# Patient Record
Sex: Female | Born: 1975
Health system: Southern US, Community
[De-identification: ages and names within clinical notes are randomized; demographics above are authoritative.]

## PROBLEM LIST (undated history)

## (undated) DIAGNOSIS — K219 Gastro-esophageal reflux disease without esophagitis: Secondary | ICD-10-CM

## (undated) DIAGNOSIS — F419 Anxiety disorder, unspecified: Secondary | ICD-10-CM

## (undated) HISTORY — DX: Anxiety disorder, unspecified: F41.9

## (undated) HISTORY — PX: MOUTH SURGERY: SHX715

---

## 2011-08-07 ENCOUNTER — Ambulatory Visit: Payer: Self-pay | Admitting: Obstetrics and Gynecology

## 2012-02-29 ENCOUNTER — Ambulatory Visit (INDEPENDENT_AMBULATORY_CARE_PROVIDER_SITE_OTHER): Payer: BC Managed Care – HMO | Admitting: Obstetrics and Gynecology

## 2012-02-29 ENCOUNTER — Encounter: Payer: Self-pay | Admitting: Obstetrics and Gynecology

## 2012-02-29 VITALS — BP 106/70 | Resp 14 | Ht 66.0 in | Wt 280.0 lb

## 2012-02-29 DIAGNOSIS — Z01419 Encounter for gynecological examination (general) (routine) without abnormal findings: Secondary | ICD-10-CM

## 2012-02-29 DIAGNOSIS — N920 Excessive and frequent menstruation with regular cycle: Secondary | ICD-10-CM

## 2012-02-29 DIAGNOSIS — Z124 Encounter for screening for malignant neoplasm of cervix: Secondary | ICD-10-CM

## 2012-02-29 DIAGNOSIS — Z113 Encounter for screening for infections with a predominantly sexual mode of transmission: Secondary | ICD-10-CM

## 2012-02-29 DIAGNOSIS — L680 Hirsutism: Secondary | ICD-10-CM

## 2012-02-29 LAB — CBC
HCT: 31.5 % — ABNORMAL LOW (ref 36.0–46.0)
Hemoglobin: 9.3 g/dL — ABNORMAL LOW (ref 12.0–15.0)
MCHC: 29.5 g/dL — ABNORMAL LOW (ref 30.0–36.0)
RBC: 4.27 MIL/uL (ref 3.87–5.11)
WBC: 7 10*3/uL (ref 4.0–10.5)

## 2012-02-29 MED ORDER — EFLORNITHINE HCL 13.9 % EX CREA: 1.0000 "application " | TOPICAL_CREAM | Freq: Two times a day (BID) | CUTANEOUS | Status: DC

## 2012-02-29 NOTE — Addendum Note (Signed)
Addended by: Marla Roe A on: 02/29/2012 12:20 PM   Modules accepted: Orders

## 2012-02-29 NOTE — Progress Notes (Signed)
Patient ID: Kirah Stice, female   DOB: 05-17-1975, 36 y.o.   MRN: [id number] Contraception none Last pap 2010 wnl Last Mammo never Last Colonoscopy never Last Dexa Scan never Primary MD none Abuse at Home none  Reports heavy cycle changing pad q56min on heavy day.  She says her cycles are regular.  Filed Vitals:   02/29/12 1118  BP: 106/70  Resp: 14   ROS: noncontributory  Physical Examination: General appearance - alert, well appearing, and in no distress, facial hair Neck - supple, no significant adenopathy Chest - clear to auscultation, no wheezes, rales or rhonchi, symmetric air entry Heart - normal rate and regular rhythm Abdomen - soft, nontender, nondistended, no masses or organomegaly Breasts - breasts appear normal, no suspicious masses, no skin or nipple changes or axillary nodes Pelvic - normal external genitalia, vulva, vagina, cervix, uterus and adnexa Back exam - no CVAT Extremities - no edema, redness or tenderness in the calves or thighs  A/P Hirsutism - check labs - trial of vaniqa Menorrhagia - check labs, sched u/s and embx RTO for u/s and f/u and embx

## 2012-03-01 LAB — TSH: TSH: 1.333 u[IU]/mL (ref 0.350–4.500)

## 2012-03-01 LAB — HIV ANTIBODY (ROUTINE TESTING W REFLEX): HIV: NONREACTIVE

## 2012-03-01 LAB — TESTOSTERONE, FREE, TOTAL, SHBG: Testosterone, Free: 10.6 pg/mL — ABNORMAL HIGH (ref 0.6–6.8)

## 2012-03-01 LAB — VITAMIN D 25 HYDROXY (VIT D DEFICIENCY, FRACTURES): Vit D, 25-Hydroxy: 11 ng/mL — ABNORMAL LOW (ref 30–89)

## 2012-03-04 LAB — PAP IG, CT-NG, RFX HPV ASCU
Chlamydia Probe Amp: NEGATIVE
GC Probe Amp: NEGATIVE

## 2012-03-06 ENCOUNTER — Telehealth: Payer: Self-pay

## 2012-03-06 ENCOUNTER — Other Ambulatory Visit: Payer: Self-pay

## 2012-03-06 DIAGNOSIS — E559 Vitamin D deficiency, unspecified: Secondary | ICD-10-CM

## 2012-03-06 NOTE — Telephone Encounter (Signed)
Spoke to pt to inform of test results. Vit D protocol done. Rx called to [facility] on [address] [employer]. Vit D soft-gels 50,000 units sig: 1 po twice weekly x 8 weeks # 16 0 RF's. Iron supplement recommended daily as well.  Virginia Flowers

## 2012-03-14 NOTE — Telephone Encounter (Signed)
Note to close encounter. Virginia Flowers, Virginia Flowers  

## 2012-04-01 ENCOUNTER — Telehealth: Payer: Self-pay | Admitting: Obstetrics and Gynecology

## 2012-04-01 NOTE — Telephone Encounter (Signed)
LM for pt to cb re: ? She had about letter she rec'd. Melody Comas A

## 2012-04-04 ENCOUNTER — Telehealth: Payer: Self-pay

## 2012-04-04 NOTE — Telephone Encounter (Signed)
Spoke with pt this am regarding letter that she received in the mail.  Pt was advised that the letter was regarding future lab ordered for Vit-d. Pt also had questions about HSV 1 and 2. Pt was given necessary information.  Pt voice understanding. Mathis Bud

## 2012-04-10 ENCOUNTER — Other Ambulatory Visit: Payer: BC Managed Care – HMO

## 2012-04-10 ENCOUNTER — Encounter: Payer: BC Managed Care – HMO | Admitting: Obstetrics and Gynecology

## 2012-05-07 ENCOUNTER — Encounter: Payer: BC Managed Care – HMO | Admitting: Obstetrics and Gynecology

## 2012-05-07 ENCOUNTER — Other Ambulatory Visit: Payer: BC Managed Care – HMO

## 2012-06-15 ENCOUNTER — Other Ambulatory Visit: Payer: Self-pay

## 2012-10-05 DIAGNOSIS — D649 Anemia, unspecified: Secondary | ICD-10-CM | POA: Insufficient documentation

## 2013-03-06 ENCOUNTER — Other Ambulatory Visit: Payer: Self-pay

## 2013-06-04 DIAGNOSIS — M7660 Achilles tendinitis, unspecified leg: Secondary | ICD-10-CM | POA: Insufficient documentation

## 2013-10-29 ENCOUNTER — Other Ambulatory Visit: Payer: Self-pay | Admitting: Obstetrics and Gynecology

## 2013-10-29 DIAGNOSIS — Z1231 Encounter for screening mammogram for malignant neoplasm of breast: Secondary | ICD-10-CM

## 2013-11-12 ENCOUNTER — Ambulatory Visit
Admission: RE | Admit: 2013-11-12 | Discharge: 2013-11-12 | Disposition: A | Discharge status: Home / Self Care | Payer: BC Managed Care – PPO | Source: Ambulatory Visit | Attending: Obstetrics and Gynecology | Admitting: Obstetrics and Gynecology

## 2013-11-12 DIAGNOSIS — Z1231 Encounter for screening mammogram for malignant neoplasm of breast: Secondary | ICD-10-CM

## 2014-04-16 ENCOUNTER — Encounter (HOSPITAL_COMMUNITY): Payer: Self-pay | Admitting: Cardiology

## 2014-04-16 ENCOUNTER — Emergency Department (HOSPITAL_COMMUNITY)
Admission: EM | Admit: 2014-04-16 | Discharge: 2014-04-16 | Disposition: A | Discharge status: Home / Self Care | Payer: BC Managed Care – PPO | Attending: Emergency Medicine | Admitting: Emergency Medicine

## 2014-04-16 ENCOUNTER — Emergency Department (HOSPITAL_COMMUNITY): Payer: BC Managed Care – PPO

## 2014-04-16 DIAGNOSIS — R0600 Dyspnea, unspecified: Secondary | ICD-10-CM | POA: Diagnosis not present

## 2014-04-16 DIAGNOSIS — Z720 Tobacco use: Secondary | ICD-10-CM | POA: Insufficient documentation

## 2014-04-16 DIAGNOSIS — R110 Nausea: Secondary | ICD-10-CM | POA: Diagnosis not present

## 2014-04-16 DIAGNOSIS — K219 Gastro-esophageal reflux disease without esophagitis: Secondary | ICD-10-CM | POA: Insufficient documentation

## 2014-04-16 DIAGNOSIS — R091 Pleurisy: Secondary | ICD-10-CM | POA: Diagnosis not present

## 2014-04-16 DIAGNOSIS — R079 Chest pain, unspecified: Secondary | ICD-10-CM | POA: Diagnosis not present

## 2014-04-16 DIAGNOSIS — Z79899 Other long term (current) drug therapy: Secondary | ICD-10-CM | POA: Diagnosis not present

## 2014-04-16 DIAGNOSIS — M25571 Pain in right ankle and joints of right foot: Secondary | ICD-10-CM

## 2014-04-16 LAB — BASIC METABOLIC PANEL
Anion gap: 8 (ref 5–15)
BUN: 5 mg/dL — AB (ref 6–23)
CALCIUM: 8.7 mg/dL (ref 8.4–10.5)
CHLORIDE: 104 meq/L (ref 96–112)
CO2: 27 mEq/L (ref 19–32)
CREATININE: 0.74 mg/dL (ref 0.50–1.10)
GFR calc non Af Amer: >90 mL/min (ref >90)
Glucose, Bld: 83 mg/dL (ref 70–99)
Potassium: 3.9 mEq/L (ref 3.7–5.3)
Sodium: 139 mEq/L (ref 137–147)

## 2014-04-16 LAB — URINALYSIS, ROUTINE W REFLEX MICROSCOPIC
Bilirubin Urine: NEGATIVE
Glucose, UA: NEGATIVE mg/dL
Ketones, ur: NEGATIVE mg/dL
Nitrite: NEGATIVE
PH: 7.5 (ref 5.0–8.0)
Protein, ur: NEGATIVE mg/dL
Specific Gravity, Urine: 1.008 (ref 1.005–1.030)
Urobilinogen, UA: 0.2 mg/dL (ref 0.0–1.0)

## 2014-04-16 LAB — CBC
HEMATOCRIT: 26.9 % — AB (ref 36.0–46.0)
Hemoglobin: 7.4 g/dL — ABNORMAL LOW (ref 12.0–15.0)
MCH: 19.3 pg — AB (ref 26.0–34.0)
MCHC: 27.5 g/dL — AB (ref 30.0–36.0)
MCV: 70.2 fL — AB (ref 78.0–100.0)
PLATELETS: 198 10*3/uL (ref 150–400)
RBC: 3.83 MIL/uL — ABNORMAL LOW (ref 3.87–5.11)
RDW: 19.7 % — ABNORMAL HIGH (ref 11.5–15.5)
WBC: 4.5 10*3/uL (ref 4.0–10.5)

## 2014-04-16 LAB — LIPASE, BLOOD: Lipase: 16 U/L (ref 11–59)

## 2014-04-16 LAB — HEPATIC FUNCTION PANEL
ALT: 8 U/L (ref 0–35)
AST: 13 U/L (ref 0–37)
Albumin: 3.3 g/dL — ABNORMAL LOW (ref 3.5–5.2)
Alkaline Phosphatase: 64 U/L (ref 39–117)
Bilirubin, Direct: <0.2 mg/dL (ref 0.0–0.3)
Total Bilirubin: <0.2 mg/dL — ABNORMAL LOW (ref 0.3–1.2)
Total Protein: 6.9 g/dL (ref 6.0–8.3)

## 2014-04-16 LAB — D-DIMER, QUANTITATIVE (NOT AT ARMC): D-Dimer, Quant: 0.73 ug/mL-FEU — ABNORMAL HIGH (ref 0.00–0.48)

## 2014-04-16 LAB — URINE MICROSCOPIC-ADD ON

## 2014-04-16 LAB — I-STAT TROPONIN, ED: Troponin i, poc: 0 ng/mL (ref 0.00–0.08)

## 2014-04-16 LAB — PRO B NATRIURETIC PEPTIDE: Pro B Natriuretic peptide (BNP): 61.4 pg/mL (ref 0–125)

## 2014-04-16 LAB — TROPONIN I: Troponin I: <0.3 ng/mL (ref <0.30)

## 2014-04-16 MED ORDER — HYDROMORPHONE HCL 1 MG/ML IJ SOLN
1.0000 mg | Freq: Once | INTRAMUSCULAR | Status: AC
  Administered 2014-04-16: 1 mg via INTRAVENOUS
  Filled 2014-04-16: qty 1

## 2014-04-16 MED ORDER — IOHEXOL 350 MG/ML SOLN
80.0000 mL | Freq: Once | INTRAVENOUS | Status: AC | PRN
  Administered 2014-04-16: 80 mL via INTRAVENOUS

## 2014-04-16 NOTE — ED Notes (Signed)
Pt returned from X-ray.  

## 2014-04-16 NOTE — Discharge Instructions (Signed)

## 2014-04-16 NOTE — ED Notes (Signed)
Pt reports generalized chest pain that started yesterday. States that she has also had some SOB. Pt reports right ankle pain also.

## 2014-04-16 NOTE — ED Provider Notes (Signed)
CSN: [id number]     Arrival date & time 04/16/14  [id number] History   First MD Initiated Contact with Patient 04/16/14 0945     Chief Complaint  Patient presents with  . Chest Pain  . Ankle Pain     (Consider location/radiation/quality/duration/timing/severity/associated sxs/prior Treatment) Patient is a 38 y.o. female presenting with chest pain.  Chest Pain Pain location:  L chest Pain quality: sharp   Pain radiates to:  Does not radiate Pain radiates to the back: no   Pain severity:  Moderate Onset quality:  Gradual Duration:  1 day Timing:  Constant Progression:  Unchanged Chronicity:  New Context: breathing   Relieved by:  Nothing Worsened by:  Coughing and deep breathing Associated symptoms: cough and nausea   Associated symptoms: no abdominal pain, no fever, no near-syncope and not vomiting     History reviewed. No pertinent past medical history. History reviewed. No pertinent past surgical history. History reviewed. No pertinent family history. History  Substance Use Topics  . Smoking status: Current Every Day Smoker -- 1.00 packs/day for 18 years    Types: Cigarettes  . Smokeless tobacco: Not on file  . Alcohol Use: Not on file   OB History    Gravida Para Term Preterm AB TAB SAB Ectopic Multiple Living   1 1 1       1      Review of Systems  Constitutional: Negative for fever.  Respiratory: Positive for cough.   Cardiovascular: Positive for chest pain. Negative for near-syncope.  Gastrointestinal: Positive for nausea. Negative for vomiting and abdominal pain.  All other systems reviewed and are negative.     Allergies  Review of patient's allergies indicates no known allergies.  Home Medications   Prior to Admission medications   Medication Sig Start Date End Date Taking? Authorizing Provider  omeprazole (PRILOSEC) 20 MG capsule Take 20 mg by mouth daily. 06/04/13  Yes Historical Provider, MD  Eflornithine HCl 13.9 % cream Apply 1 application topically  2 (two) times daily. At least 8hrs apart and may take 4-8wks to see improvement.  Apply to affected area Patient not taking: Reported on 04/16/2014 02/29/12   Purcell NailsAngela Y Roberts, MD   BP 100/54 mmHg  Pulse 70  Temp(Src) 98.2 F (36.8 C) (Oral)  Resp 19  Wt 280 lb (127.007 kg)  SpO2 100%  LMP 04/15/2014 Physical Exam  Constitutional: She is oriented to person, place, and time. She appears well-developed and well-nourished.  HENT:  Head: Normocephalic and atraumatic.  Right Ear: External ear normal.  Left Ear: External ear normal.  Eyes: Conjunctivae and EOM are normal. Pupils are equal, round, and reactive to light.  Neck: Normal range of motion. Neck supple.  Cardiovascular: Normal rate, regular rhythm, normal heart sounds and intact distal pulses.   Pulmonary/Chest: Effort normal and breath sounds normal.  Abdominal: Soft. Bowel sounds are normal. There is no tenderness.  Musculoskeletal: Normal range of motion.  Neurological: She is alert and oriented to person, place, and time.  Skin: Skin is warm and dry.  Vitals reviewed.   ED Course  Procedures (including critical care time) Labs Review Labs Reviewed  CBC - Abnormal; Notable for the following:    RBC 3.83 (*)    Hemoglobin 7.4 (*)    HCT 26.9 (*)    MCV 70.2 (*)    MCH 19.3 (*)    MCHC 27.5 (*)    RDW 19.7 (*)    All other components within normal limits  BASIC METABOLIC PANEL - Abnormal; Notable for the following:    BUN 5 (*)    All other components within normal limits  D-DIMER, QUANTITATIVE - Abnormal; Notable for the following:    D-Dimer, Quant 0.73 (*)    All other components within normal limits  URINALYSIS, ROUTINE W REFLEX MICROSCOPIC - Abnormal; Notable for the following:    Hgb urine dipstick LARGE (*)    Leukocytes, UA TRACE (*)    All other components within normal limits  HEPATIC FUNCTION PANEL - Abnormal; Notable for the following:    Albumin 3.3 (*)    Total Bilirubin <0.2 (*)    All other  components within normal limits  PRO B NATRIURETIC PEPTIDE  LIPASE, BLOOD  TROPONIN I  URINE MICROSCOPIC-ADD ON  Rosezena Sensor, ED    Imaging Review Dg Chest 2 View  04/16/2014   CLINICAL DATA:  Two day history of left-sided upper chest pain  EXAM: CHEST  2 VIEW  COMPARISON:  None.  FINDINGS: There is minimal left base atelectasis. Elsewhere lungs are clear. Heart is upper normal in size with pulmonary vascularity within normal limits. No adenopathy. No bone lesions.  IMPRESSION: Minimal left atelectatic change.  No edema or consolidation.   Electronically Signed   By: Bretta Bang M.D.   On: 04/16/2014 10:58   Ct Angio Chest Pe W/cm &/or Wo Cm  04/16/2014   CLINICAL DATA:  Left-sided chest pain  EXAM: CT ANGIOGRAPHY CHEST WITH CONTRAST  TECHNIQUE: Multidetector CT imaging of the chest was performed using the standard protocol during bolus administration of intravenous contrast. Multiplanar CT image reconstructions and MIPs were obtained to evaluate the vascular anatomy.  CONTRAST:  80mL OMNIPAQUE IOHEXOL 350 MG/ML SOLN  COMPARISON:  Chest radiograph April 16, 2014  FINDINGS: There is no demonstrable pulmonary embolus. There is no thoracic aortic aneurysm or dissection.  There is patchy lower lobe atelectatic change bilaterally.  On axial slice 32 series 6, there is a 4 mm nodular opacity in the anterior segment of the right upper lobe. On axial slice 35 series 6, there is a 3 mm nodular opacity in the anterior segment of the right upper lobe. On axial slice 53 series 6, there is a 3 mm nodular opacity in the lateral segment of the right middle lobe. On axial slice 38 series 6, there is a 4 mm nodular opacity in the superior segment of the left lower lobe.  There is no appreciable thoracic adenopathy. There are small axillary lymph nodes bilaterally which do not meet size criteria for pathologic significance. The pericardium is not thickened.  In the visualized upper abdomen, no focal  lesions are appreciable.  Visualized thyroid appears normal. There are no blastic or lytic bone lesions.  Review of the MIP images confirms the above findings.  IMPRESSION: Patchy bibasilar lung atelectasis. No demonstrable pulmonary embolus. Small nodular opacities in the lungs as noted above. Followup of these nodular opacity should be based on Fleischner Society guidelines. If the patient is at high risk for bronchogenic carcinoma, follow-up chest CT at 1year is recommended. If the patient is at low risk, no follow-up is needed. This recommendation follows the consensus statement: Guidelines for Management of Small Pulmonary Nodules Detected on CT Scans: A Statement from the Fleischner Society as published in Radiology 2005; 237:395-400.   Electronically Signed   By: Bretta Bang M.D.   On: 04/16/2014 15:23     EKG Interpretation   Date/Time:  Thursday April 16 2014 09:39:30  EST Ventricular Rate:  85 PR Interval:  148 QRS Duration: 82 QT Interval:  360 QTC Calculation: 428 R Axis:   90 Text Interpretation:  Normal sinus rhythm Rightward axis Nonspecific T  wave abnormality Abnormal ECG No old tracing to compare Confirmed by  Mirian MoGentry, Matthew [phone]([id number]) on 04/16/2014 10:01:05 AM     Emergency Ultrasound Study:   Angiocath insertion Performed by: Mirian MoGentry, Matthew  Consent: Verbal consent obtained. Risks and benefits: risks, benefits and alternatives were discussed Immediately prior to procedure the correct patient, procedure, equipment, support staff and site/side marked as needed.  Indication: difficult IV access Preparation: Patient was prepped and draped in the usual sterile fashion. Vein Location: R brachial vein was visualized during assessment for potential access sites and was found to be patent/ easily compressed with linear ultrasound.  The needle was visualized with real-time ultrasound and guided into the vein. Gauge: 18  Image saved and stored.  Normal blood return.   Patient tolerance: Patient tolerated the procedure well with no immediate complications.     MDM   Final diagnoses:  Pleurisy  Ankle pain, right  Dyspnea  Chest pain, unspecified chest pain type    38 y.o. female with pertinent PMH of GERD presents with chest pain as above.  Symptoms improving.  Physical exam and vitals as above.  Pt has had no appreciable change over the past 6 hours.  Labs as above with positive ddimer.  CT PE study ordered and without PE, but with multiple nodular opacities.  Unknown if this is the etiology of her current symptoms, do not suspect pneumonia given clinical scenario. I discussed the results of the CT scan with the patient and encouraged very close follow-up. As she's had no change in symptoms do not feel delta troponin is warranted.  Prior to my discussion of results the patient came out from her room and asked to have her IV removed. She stated she wanted to leave. I feel that her discharge is reasonable time given negative workup. Discharged home in stable condition with strict return precautions for any worsening symptoms.  I have reviewed all laboratory and imaging studies if ordered as above  1. Ankle pain, right   2. Chest pain   3. Pleurisy   4. Dyspnea   5. Chest pain, unspecified chest pain type         Mirian MoMatthew Gentry, MD 04/16/14 1550

## 2016-01-08 IMAGING — CR DG CHEST 2V
2 series · 2 of 2 positions shown · non-contrast
Comparison: None.

CLINICAL DATA: Two day history of left-sided upper chest pain

EXAM:
CHEST  2 VIEW

[chest pa]
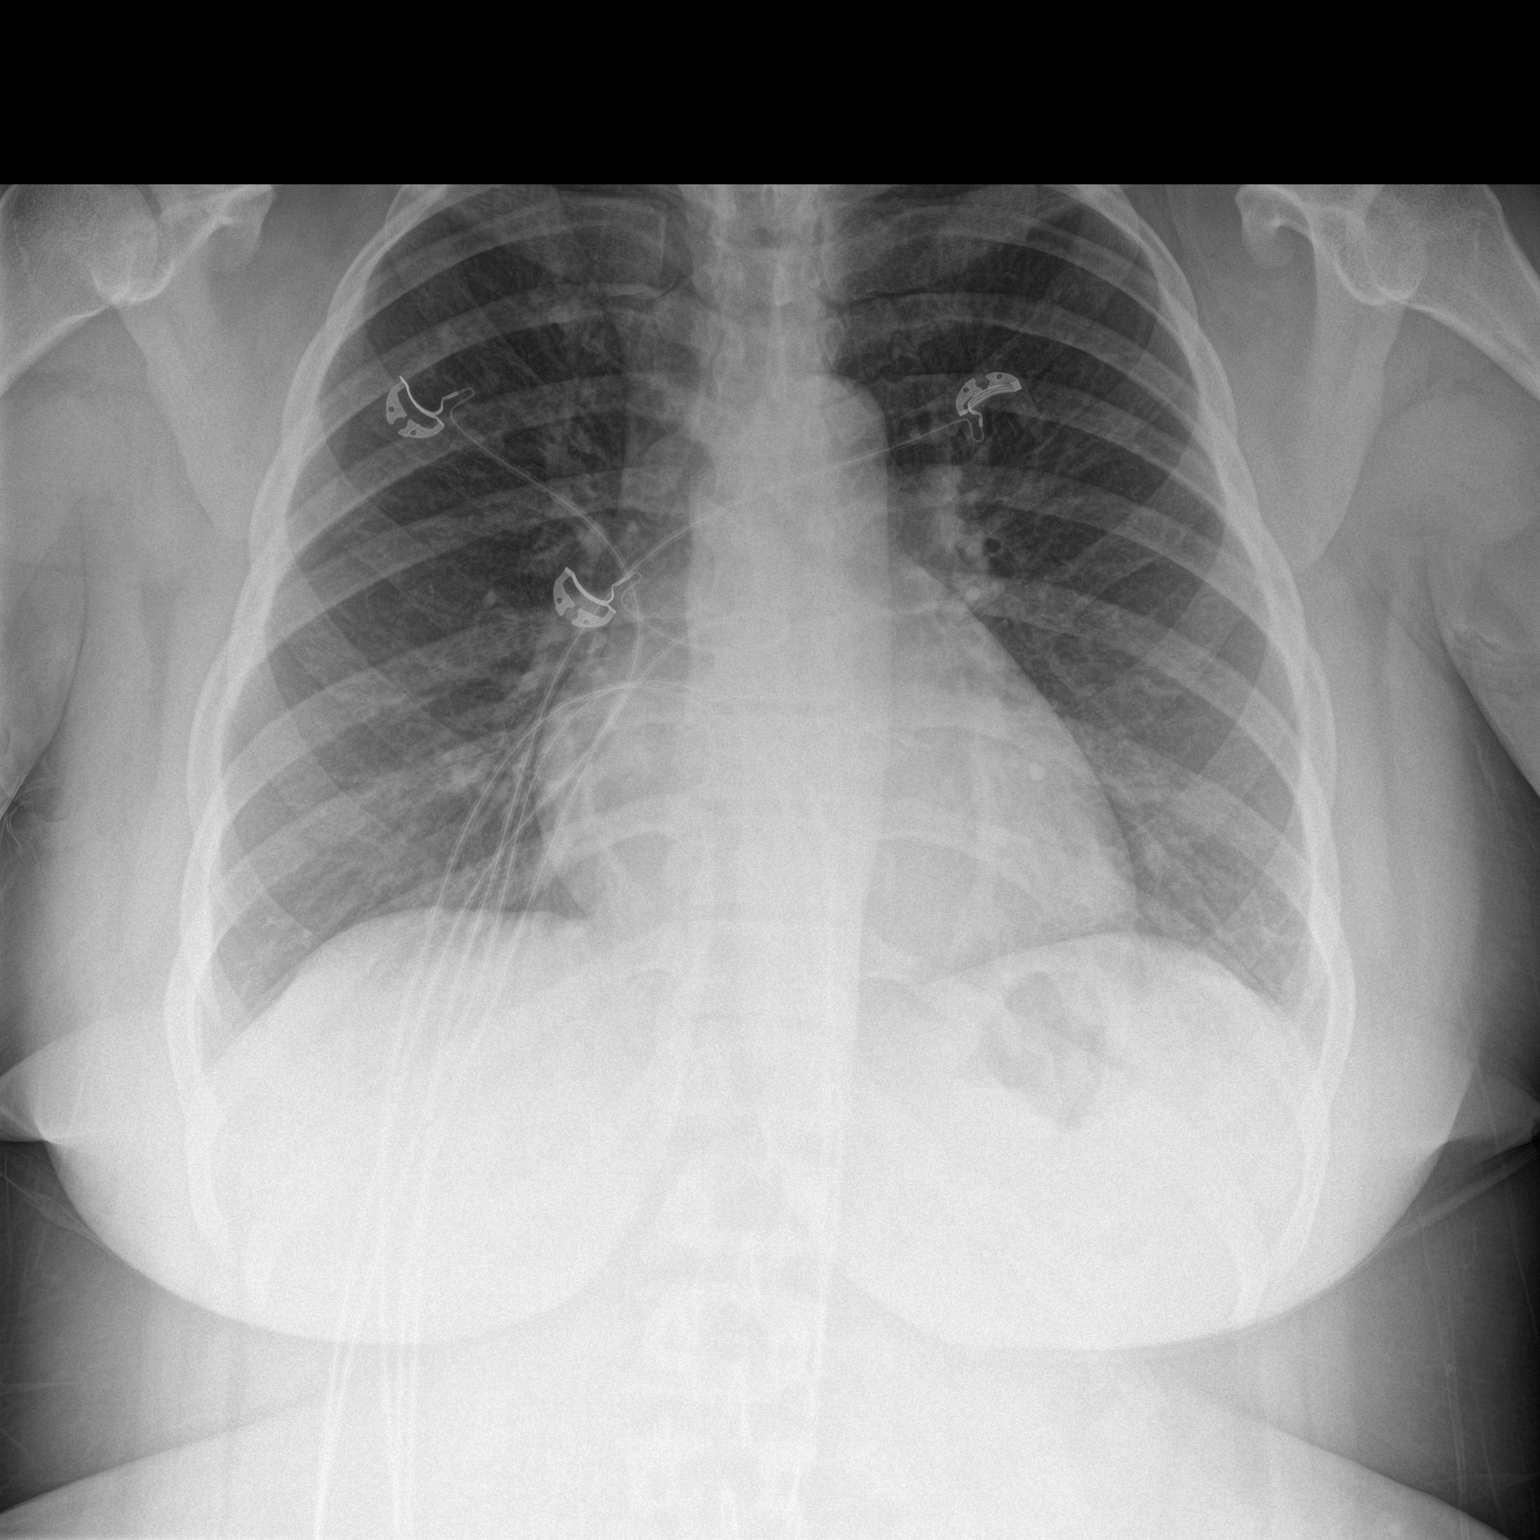

[chest lat]
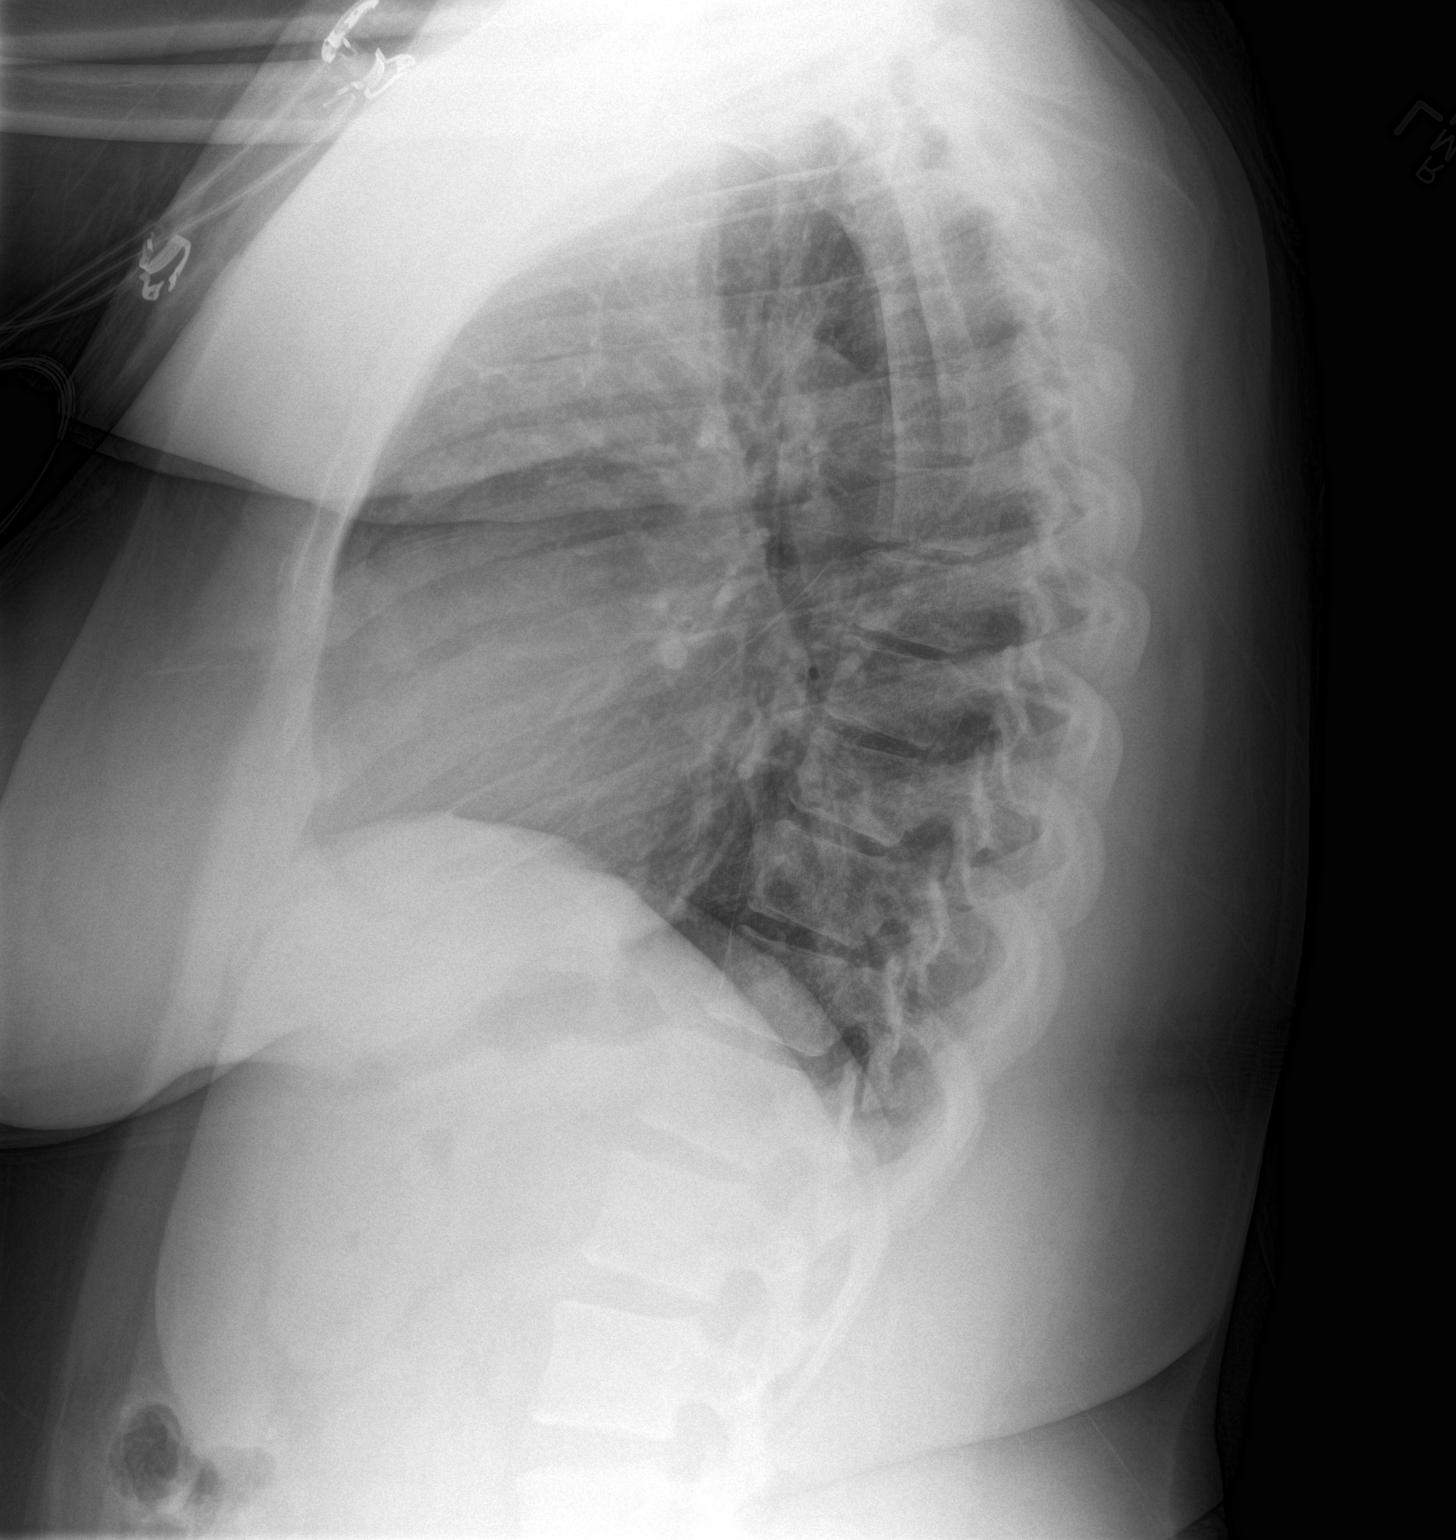

[2 of 2 positions shown; findings below may reference images not displayed]

FINDINGS: There is minimal left base atelectasis. Elsewhere lungs are clear.
Heart is upper normal in size with pulmonary vascularity within
normal limits. No adenopathy. No bone lesions.
IMPRESSION: Minimal left atelectatic change.  No edema or consolidation.

## 2017-11-11 ENCOUNTER — Emergency Department (HOSPITAL_BASED_OUTPATIENT_CLINIC_OR_DEPARTMENT_OTHER): Payer: No Typology Code available for payment source

## 2017-11-11 ENCOUNTER — Emergency Department (HOSPITAL_BASED_OUTPATIENT_CLINIC_OR_DEPARTMENT_OTHER)
Admission: EM | Admit: 2017-11-11 | Discharge: 2017-11-11 | Disposition: A | Discharge status: Home / Self Care | Payer: No Typology Code available for payment source | Attending: Emergency Medicine | Admitting: Emergency Medicine

## 2017-11-11 ENCOUNTER — Encounter (HOSPITAL_BASED_OUTPATIENT_CLINIC_OR_DEPARTMENT_OTHER): Payer: Self-pay | Admitting: Emergency Medicine

## 2017-11-11 ENCOUNTER — Other Ambulatory Visit: Payer: Self-pay

## 2017-11-11 DIAGNOSIS — Z3202 Encounter for pregnancy test, result negative: Secondary | ICD-10-CM | POA: Insufficient documentation

## 2017-11-11 DIAGNOSIS — R0789 Other chest pain: Secondary | ICD-10-CM | POA: Diagnosis present

## 2017-11-11 DIAGNOSIS — F17210 Nicotine dependence, cigarettes, uncomplicated: Secondary | ICD-10-CM | POA: Diagnosis not present

## 2017-11-11 DIAGNOSIS — K2960 Other gastritis without bleeding: Secondary | ICD-10-CM | POA: Insufficient documentation

## 2017-11-11 HISTORY — DX: Gastro-esophageal reflux disease without esophagitis: K21.9

## 2017-11-11 LAB — URINALYSIS, ROUTINE W REFLEX MICROSCOPIC
Bilirubin Urine: NEGATIVE
Glucose, UA: NEGATIVE mg/dL
Hgb urine dipstick: NEGATIVE
KETONES UR: NEGATIVE mg/dL
LEUKOCYTES UA: NEGATIVE
NITRITE: NEGATIVE
PROTEIN: NEGATIVE mg/dL
Specific Gravity, Urine: <1.005 — ABNORMAL LOW (ref 1.005–1.030)
pH: 7 (ref 5.0–8.0)

## 2017-11-11 LAB — COMPREHENSIVE METABOLIC PANEL
ALT: 13 U/L (ref 0–44)
ANION GAP: 6 (ref 5–15)
AST: 20 U/L (ref 15–41)
Albumin: 3.5 g/dL (ref 3.5–5.0)
Alkaline Phosphatase: 65 U/L (ref 38–126)
BILIRUBIN TOTAL: 0.2 mg/dL — AB (ref 0.3–1.2)
BUN: 7 mg/dL (ref 6–20)
CO2: 25 mmol/L (ref 22–32)
Calcium: 8.4 mg/dL — ABNORMAL LOW (ref 8.9–10.3)
Chloride: 109 mmol/L (ref 98–111)
Creatinine, Ser: 0.71 mg/dL (ref 0.44–1.00)
GFR calc Af Amer: >60 mL/min (ref >60)
Glucose, Bld: 94 mg/dL (ref 70–99)
Potassium: 3.3 mmol/L — ABNORMAL LOW (ref 3.5–5.1)
Sodium: 140 mmol/L (ref 135–145)
TOTAL PROTEIN: 6.9 g/dL (ref 6.5–8.1)

## 2017-11-11 LAB — CBC
HCT: 30.8 % — ABNORMAL LOW (ref 36.0–46.0)
HEMOGLOBIN: 8.9 g/dL — AB (ref 12.0–15.0)
MCH: 20.3 pg — ABNORMAL LOW (ref 26.0–34.0)
MCHC: 28.9 g/dL — ABNORMAL LOW (ref 30.0–36.0)
MCV: 70.2 fL — AB (ref 78.0–100.0)
Platelets: 142 10*3/uL — ABNORMAL LOW (ref 150–400)
RBC: 4.39 MIL/uL (ref 3.87–5.11)
RDW: 20.4 % — ABNORMAL HIGH (ref 11.5–15.5)
WBC: 4.7 10*3/uL (ref 4.0–10.5)

## 2017-11-11 LAB — LIPASE, BLOOD: LIPASE: 23 U/L (ref 11–51)

## 2017-11-11 LAB — TROPONIN I: Troponin I: <0.03 ng/mL (ref <0.03)

## 2017-11-11 LAB — PREGNANCY, URINE: PREG TEST UR: NEGATIVE

## 2017-11-11 MED ORDER — SODIUM CHLORIDE 0.9 % IV BOLUS
500.0000 mL | Freq: Once | INTRAVENOUS | Status: AC
  Administered 2017-11-11: 500 mL via INTRAVENOUS

## 2017-11-11 MED ORDER — ONDANSETRON HCL 4 MG/2ML IJ SOLN
4.0000 mg | Freq: Once | INTRAMUSCULAR | Status: AC
  Administered 2017-11-11: 4 mg via INTRAVENOUS
  Filled 2017-11-11: qty 2

## 2017-11-11 MED ORDER — GI COCKTAIL ~~LOC~~
30.0000 mL | Freq: Once | ORAL | Status: AC
  Administered 2017-11-11: 30 mL via ORAL
  Filled 2017-11-11: qty 30

## 2017-11-11 MED ORDER — POTASSIUM CHLORIDE CRYS ER 20 MEQ PO TBCR
20.0000 meq | EXTENDED_RELEASE_TABLET | Freq: Once | ORAL | Status: AC
  Administered 2017-11-11: 20 meq via ORAL
  Filled 2017-11-11: qty 1

## 2017-11-11 MED ORDER — OMEPRAZOLE 20 MG PO CPDR: 20.0000 mg | DELAYED_RELEASE_CAPSULE | Freq: Every day | ORAL | 0 refills | Status: DC

## 2017-11-11 MED ORDER — RANITIDINE HCL 150 MG PO CAPS: 150.0000 mg | ORAL_CAPSULE | Freq: Two times a day (BID) | ORAL | 0 refills | Status: DC

## 2017-11-11 NOTE — ED Provider Notes (Signed)
[facility] EMERGENCY DEPARTMENT Provider Note   CSN: [id number] Arrival date & time: 11/11/17  1001     History   Chief Complaint Chief Complaint  Patient presents with  . Chest Pain    HPI Cindee Saltmily Hackley is a 42 y.o. female.  The history is provided by the patient. No language interpreter was used.  Chest Pain     Cindee Saltmily Spiers is a 42 y.o. female who presents to the Emergency Department complaining of chest pain. Presents to the emergency department for evaluation of sharp epigastric/central chest pain that began about two days ago. She has associated nausea with emesis two days ago. Symptoms are waxing and waning but do have a constant component. She denies any fevers. She does have mild shortness of breath and cough that she attributes to smoking. She does smoke 1 1/2 packs per day. She denies any fevers, diarrhea, dysuria. She does have chronic constipation, unchanged from baseline. No history of prior abdominal surgeries. She does have a history of acid reflux and takes omeprazole daily. She took an extra Zantac for her reflux and stated that there was no improvement in her symptoms. She is scheduled for an outpatient stress test next week for recurrent chest pain. Past Medical History:  Diagnosis Date  . GERD (gastroesophageal reflux disease)     Patient Active Problem List   Diagnosis Date Noted  . Menorrhagia 02/29/2012  . Hirsutism 02/29/2012    History reviewed. No pertinent surgical history.   OB History    Gravida  1   Para  1   Term  1   Preterm      AB      Living  1     SAB      TAB      Ectopic      Multiple      Live Births               Home Medications    Prior to Admission medications   Medication Sig Start Date End Date Taking? Authorizing Provider  Eflornithine HCl 13.9 % cream Apply 1 application topically 2 (two) times daily. At least 8hrs apart and may take 4-8wks to see improvement.  Apply to affected  area Patient not taking: Reported on 04/16/2014 02/29/12   Osborn Cohooberts, Angela, MD  omeprazole (PRILOSEC) 20 MG capsule Take 1 capsule (20 mg total) by mouth daily. 11/11/17   Tilden Fossaees, Kingstyn Deruiter, MD  ranitidine (ZANTAC) 150 MG capsule Take 1 capsule (150 mg total) by mouth 2 (two) times daily. 11/11/17   Tilden Fossaees, Nastacia Raybuck, MD    Family History No family history on file.  Social History Social History   Tobacco Use  . Smoking status: Current Every Day Smoker    Packs/day: 1.00    Years: 18.00    Pack years: 18.00    Types: Cigarettes  . Smokeless tobacco: Never Used  Substance Use Topics  . Alcohol use: Never    Frequency: Never  . Drug use: Yes    Types: Marijuana     Allergies   Patient has no known allergies.   Review of Systems Review of Systems  Cardiovascular: Positive for chest pain.  All other systems reviewed and are negative.    Physical Exam Updated Vital Signs BP 134/73 (BP Location: Right Arm)   Pulse 92   Temp 98.4 F (36.9 C) (Oral)   Resp 18   Ht 5\' 6"  (1.676 m)   Wt 108.9 kg (240 lb)  LMP 10/01/2017   SpO2 98%   BMI 38.74 kg/m   Physical Exam  Constitutional: She is oriented to person, place, and time. She appears well-developed and well-nourished.  HENT:  Head: Normocephalic and atraumatic.  Cardiovascular: Normal rate and regular rhythm.  No murmur heard. Pulmonary/Chest: Effort normal and breath sounds normal. No respiratory distress.  Abdominal: Soft. There is no rebound and no guarding.  Mild to moderate epigastric, left upper quadrant left lower quadrant tenderness without any guarding or rebound  Musculoskeletal: She exhibits no edema or tenderness.  Neurological: She is alert and oriented to person, place, and time.  Skin: Skin is warm and dry.  Psychiatric: She has a normal mood and affect. Her behavior is normal.  Nursing note and vitals reviewed.    ED Treatments / Results  Labs (all labs ordered are listed, but only abnormal  results are displayed) Labs Reviewed  CBC - Abnormal; Notable for the following components:      Result Value   Hemoglobin 8.9 (*)    HCT 30.8 (*)    MCV 70.2 (*)    MCH 20.3 (*)    MCHC 28.9 (*)    RDW 20.4 (*)    Platelets 142 (*)    All other components within normal limits  COMPREHENSIVE METABOLIC PANEL - Abnormal; Notable for the following components:   Potassium 3.3 (*)    Calcium 8.4 (*)    Total Bilirubin 0.2 (*)    All other components within normal limits  URINALYSIS, ROUTINE W REFLEX MICROSCOPIC - Abnormal; Notable for the following components:   Specific Gravity, Urine <1.005 (*)    All other components within normal limits  TROPONIN I  LIPASE, BLOOD  PREGNANCY, URINE    EKG EKG Interpretation  Date/Time:  Sunday November 11 2017 10:06:49 EDT Ventricular Rate:  91 PR Interval:    QRS Duration: 82 QT Interval:  336 QTC Calculation: 414 R Axis:   82 Text Interpretation:  Sinus rhythm Borderline T wave abnormalities No significant change since last tracing Confirmed by Tilden Fossa [phone]) on 11/11/2017 10:15:18 AM   Radiology Dg Chest 2 View  Result Date: 11/11/2017 CLINICAL DATA:  Chest and epigastric pain. EXAM: CHEST - 2 VIEW COMPARISON:  10/28/2017 FINDINGS: The heart size and mediastinal contours are within normal limits. Both lungs are clear. The visualized skeletal structures are unremarkable. IMPRESSION: No active cardiopulmonary disease. Electronically Signed   By: Signa Kell M.D.   On: 11/11/2017 10:24    Procedures Procedures (including critical care time)  Medications Ordered in ED Medications  sodium chloride 0.9 % bolus 500 mL (500 mLs Intravenous New Bag/Given 11/11/17 1042)  potassium chloride SA (K-DUR,KLOR-CON) CR tablet 20 mEq (has no administration in time range)  gi cocktail (Maalox,Lidocaine,Donnatal) (30 mLs Oral Given 11/11/17 1042)  ondansetron (ZOFRAN) injection 4 mg (4 mg Intravenous Given 11/11/17 1042)     Initial  Impression / Assessment and Plan / ED Course  I have reviewed the triage vital signs and the nursing notes.  Pertinent labs & imaging results that were available during my care of the patient were reviewed by me and considered in my medical decision making (see chart for details).     Patient here for evaluation of several days of epigastric/chest pain. She does have epigastric tenderness on examination without peritoneal findings. Current presentation is not consistent with ACS, PE, dissection, pancreatitis, cholecystitis. She is feeling improved following G.I. cocktail. Counseled patient on home care for reflux. Instructed in diet  changes, medications. Discussed outpatient follow-up as well as return precautions.  Records reviewed in care everywhere. Patient has a stable anemia, mild stable hypokalemia.  Final Clinical Impressions(s) / ED Diagnoses   Final diagnoses:  Reflux gastritis    ED Discharge Orders        Ordered    omeprazole (PRILOSEC) 20 MG capsule  Daily     11/11/17 1125    ranitidine (ZANTAC) 150 MG capsule  2 times daily     11/11/17 1125       Tilden Fossa, MD 11/11/17 1127

## 2017-11-11 NOTE — Discharge Instructions (Addendum)
Take your omeprazole and Zantac as directed. Please follow-up with your doctor for recheck. Get reevaluated immediately if you develop any new or concerning symptoms.

## 2017-11-11 NOTE — ED Triage Notes (Addendum)
Central chest pain since this morning, sharp in nature. States it feels like acid reflux

## 2019-08-05 IMAGING — CR DG CHEST 2V
2 series · 2 of 2 positions shown · non-contrast
Comparison: 10/28/2017

CLINICAL DATA: Chest and epigastric pain.

EXAM:
CHEST - 2 VIEW

[w chest pa]
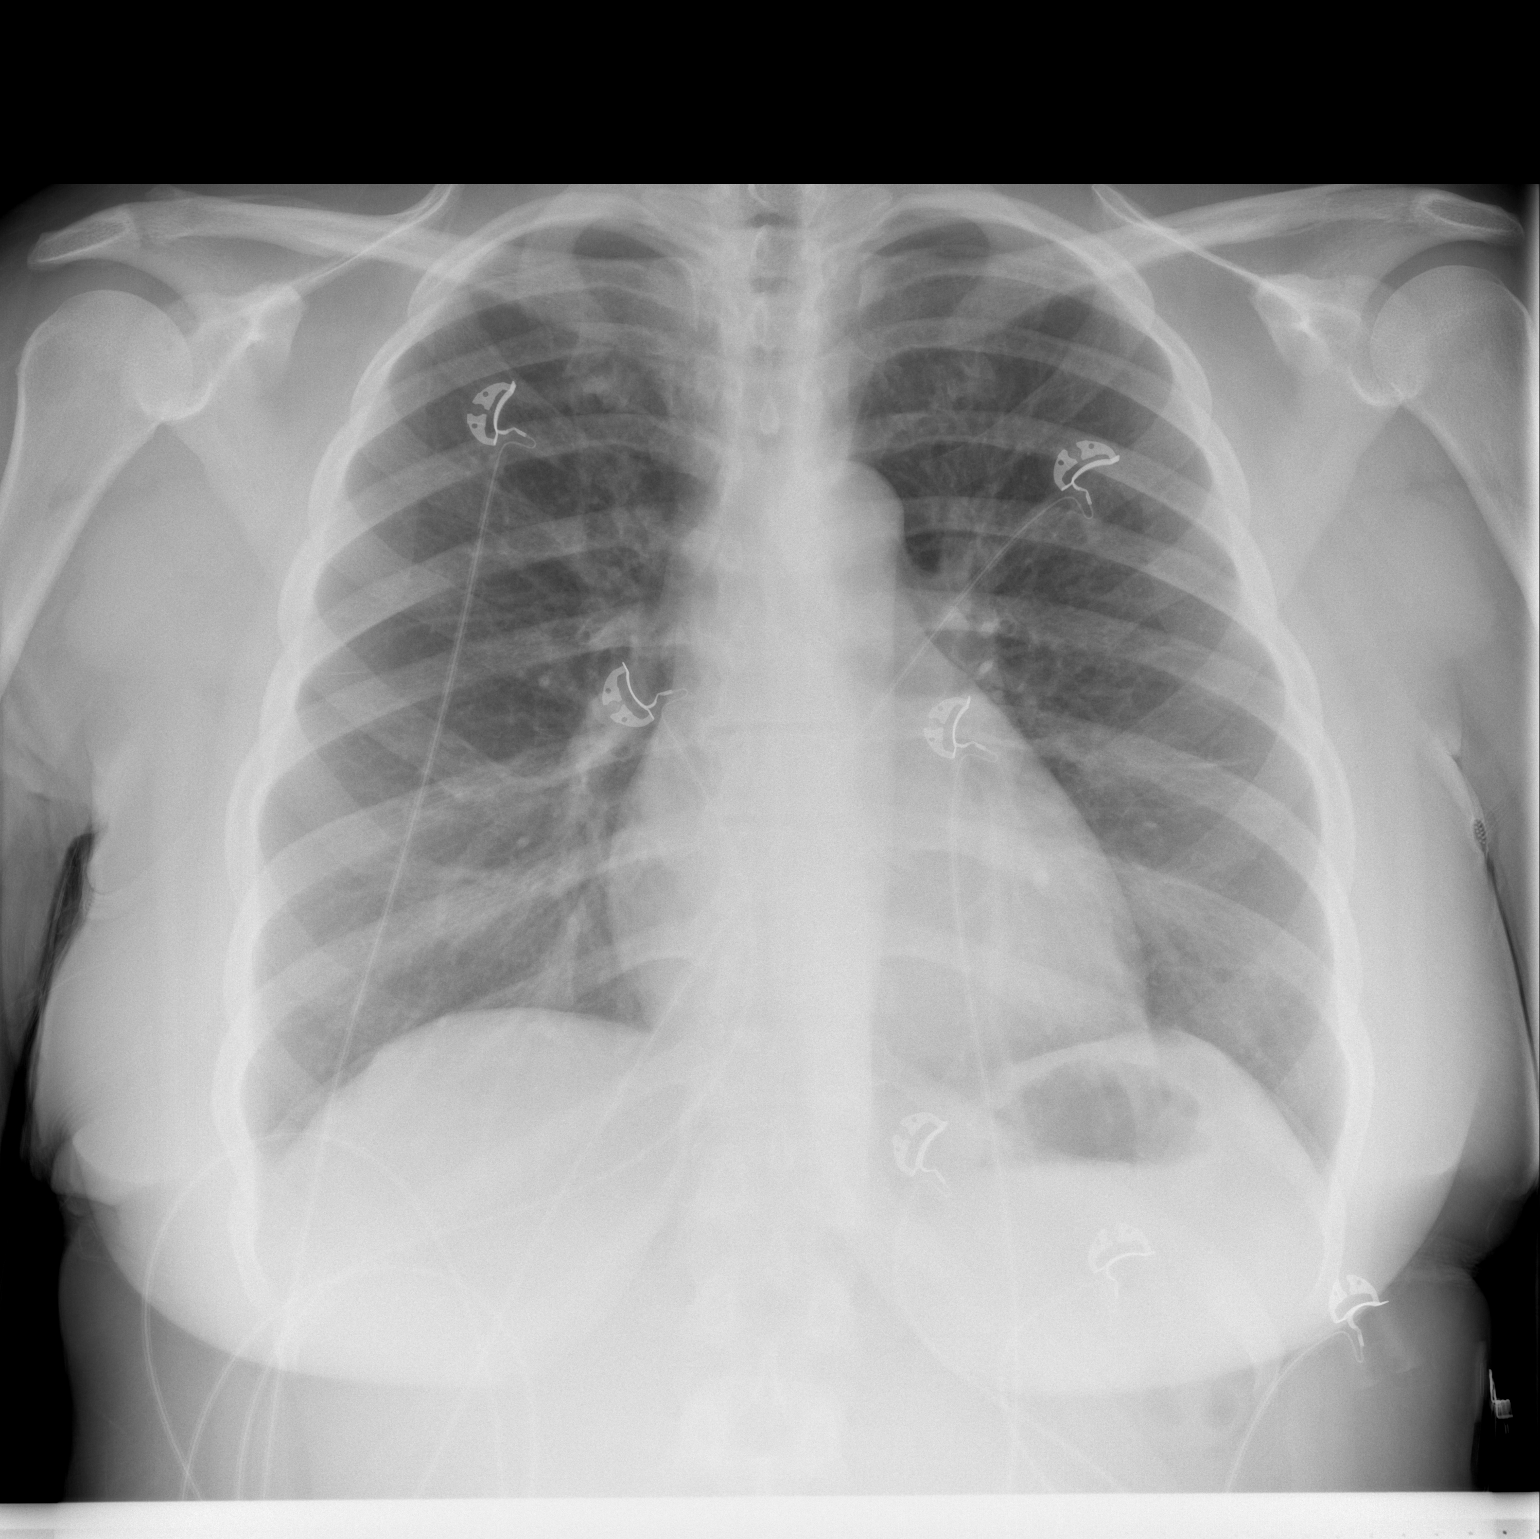

[w chest lat]
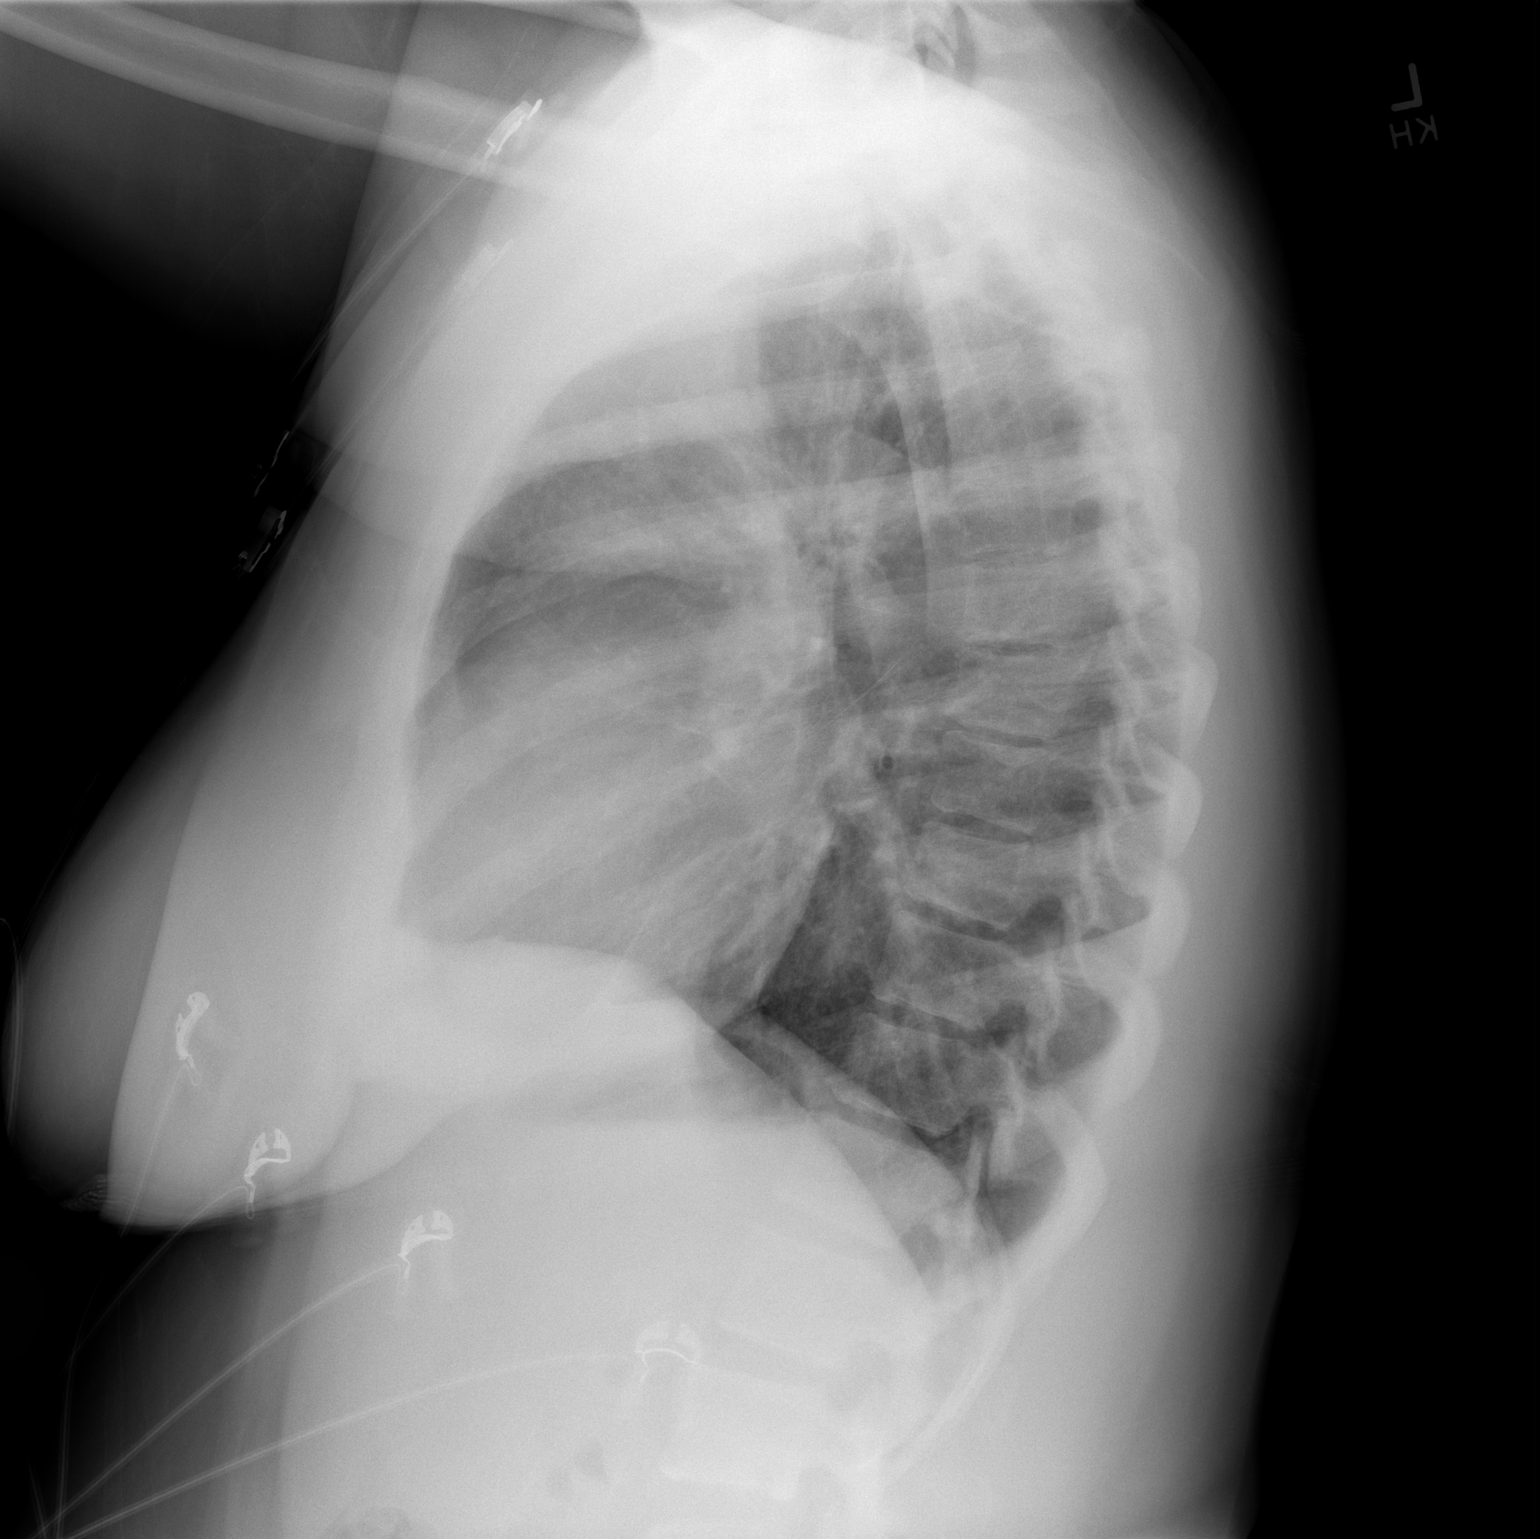

[2 of 2 positions shown; findings below may reference images not displayed]

FINDINGS: The heart size and mediastinal contours are within normal limits.
Both lungs are clear. The visualized skeletal structures are
unremarkable.
IMPRESSION: No active cardiopulmonary disease.

## 2020-03-16 ENCOUNTER — Other Ambulatory Visit: Payer: Self-pay

## 2020-03-16 ENCOUNTER — Ambulatory Visit (INDEPENDENT_AMBULATORY_CARE_PROVIDER_SITE_OTHER): Payer: BC Managed Care – PPO | Admitting: Family Medicine

## 2020-03-16 ENCOUNTER — Encounter: Payer: Self-pay | Admitting: Family Medicine

## 2020-03-16 VITALS — BP 114/64 | HR 84 | Temp 97.8°F | Wt 270.6 lb

## 2020-03-16 DIAGNOSIS — G5600 Carpal tunnel syndrome, unspecified upper limb: Secondary | ICD-10-CM | POA: Insufficient documentation

## 2020-03-16 DIAGNOSIS — G5601 Carpal tunnel syndrome, right upper limb: Secondary | ICD-10-CM

## 2020-03-16 DIAGNOSIS — E559 Vitamin D deficiency, unspecified: Secondary | ICD-10-CM | POA: Insufficient documentation

## 2020-03-16 DIAGNOSIS — R42 Dizziness and giddiness: Secondary | ICD-10-CM | POA: Diagnosis not present

## 2020-03-16 DIAGNOSIS — E282 Polycystic ovarian syndrome: Secondary | ICD-10-CM | POA: Insufficient documentation

## 2020-03-16 DIAGNOSIS — D509 Iron deficiency anemia, unspecified: Secondary | ICD-10-CM

## 2020-03-16 DIAGNOSIS — F411 Generalized anxiety disorder: Secondary | ICD-10-CM | POA: Diagnosis not present

## 2020-03-16 DIAGNOSIS — N938 Other specified abnormal uterine and vaginal bleeding: Secondary | ICD-10-CM | POA: Insufficient documentation

## 2020-03-16 LAB — CBC: MCHC: 27.6 g/dL — ABNORMAL LOW (ref 32.0–36.0)

## 2020-03-16 MED ORDER — ESCITALOPRAM OXALATE 10 MG PO TABS: ORAL_TABLET | ORAL | 2 refills | Status: DC

## 2020-03-16 NOTE — Assessment & Plan Note (Signed)
Intermittent in nature, seems to be more vertiginous.  Will update labs today including CBC and iron due to previous anemia.

## 2020-03-16 NOTE — Assessment & Plan Note (Signed)
Discussed management options including medication and therapy.  Will start lexapro.  Return in about 6 weeks (around 04/27/2020) for anxiety.

## 2020-03-16 NOTE — Patient Instructions (Signed)
Great to meet you today! Try the home stretches Have labs completed today.  I have sent in lexapro.  Take 1/2 tab for the first week then increase to a full tab.  See me again in about 4-6 weeks.

## 2020-03-16 NOTE — Progress Notes (Signed)
Virginia Flowers - 44 y.o. female MRN [id number]  Date of birth: December 05, 1975  Subjective Chief Complaint  Patient presents with  . Establish Care    HPI Virginia Flowers is a 44 y.o. female here today for initial visit.  She has history of PCOS and iron deficiency anemia.  She has concerns today about anxiety, numbness/tingling in R hand and intermittent dizziness.   Numbness and tingling in R hand that comes and goes.  Involves most of the fingers of the hand. Denies weakness in grip strength.  This is often worse after waking up in the morning. She has not had any neck pain or numbness in to the arm/shoulder.    She has had some intermittent episodes of dizziness.  Occurs randomly.  Denies pre-syncopal feeling. She does have history of anemia.  She has not had ear fullness, headache, nausea, palpitations.   She has also had some anxiety.  Work for her has been stressful.  She has found herself smoking more due to stress.  She has never taken medication for anxiety or seen a therapist.   She would be open to trying either of these.   Depression screen PHQ 2/9 03/16/2020  Decreased Interest 2  Down, Depressed, Hopeless 2  PHQ - 2 Score 4  Altered sleeping 2  Tired, decreased energy 3  Change in appetite 3  Feeling bad or failure about yourself  3  Trouble concentrating 1  Moving slowly or fidgety/restless 0  Suicidal thoughts 0  PHQ-9 Score 16  Difficult doing work/chores Somewhat difficult   GAD 7 : Generalized Anxiety Score 03/16/2020  Nervous, Anxious, on Edge 1  Control/stop worrying 2  Worry too much - different things 2  Trouble relaxing 2  Restless 1  Easily annoyed or irritable 1  Afraid - awful might happen 2  Total GAD 7 Score 11  Anxiety Difficulty Somewhat difficult      ROS:  A comprehensive ROS was completed and negative except as noted per HPI  No Known Allergies  Past Medical History:  Diagnosis Date  . Anxiety   . GERD (gastroesophageal reflux disease)      No past surgical history on file.  Social History   Socioeconomic History  . Marital status: Single    Spouse name: Not on file  . Number of children: Not on file  . Years of education: Not on file  . Highest education level: Not on file  Occupational History  . Not on file  Tobacco Use  . Smoking status: Current Every Day Smoker    Packs/day: 1.00    Years: 18.00    Pack years: 18.00    Types: Cigarettes  . Smokeless tobacco: Never Used  Vaping Use  . Vaping Use: Never used  Substance and Sexual Activity  . Alcohol use: Never  . Drug use: Yes    Types: Marijuana  . Sexual activity: Yes    Birth control/protection: None  Other Topics Concern  . Not on file  Social History Narrative  . Not on file   Social Determinants of Health   Financial Resource Strain:   . Difficulty of Paying Living Expenses: Not on file  Food Insecurity:   . Worried About Programme researcher, broadcasting/film/video in the Last Year: Not on file  . Ran Out of Food in the Last Year: Not on file  Transportation Needs:   . Lack of Transportation (Medical): Not on file  . Lack of Transportation (Non-Medical): Not on file  Physical Activity:   . Days of Exercise per Week: Not on file  . Minutes of Exercise per Session: Not on file  Stress:   . Feeling of Stress : Not on file  Social Connections:   . Frequency of Communication with Friends and Family: Not on file  . Frequency of Social Gatherings with Friends and Family: Not on file  . Attends Religious Services: Not on file  . Active Member of Clubs or Organizations: Not on file  . Attends Banker Meetings: Not on file  . Marital Status: Not on file    Family History  Family history unknown: Yes    Health Maintenance  Topic Date Due  . TETANUS/TDAP  Never done  . PAP SMEAR-Modifier  03/01/2015  . INFLUENZA VACCINE  07/29/2020 (Originally 11/30/2019)  . COVID-19 Vaccine  Completed  . Hepatitis C Screening  Completed  . HIV Screening   Completed     ----------------------------------------------------------------------------------------------------------------------------------------------------------------------------------------------------------------- Physical Exam BP 114/64 (BP Location: Left Arm, Patient Position: Sitting, Cuff Size: Large)   Pulse 84   Temp 97.8 F (36.6 C)   Wt 270 lb 9.6 oz (122.7 kg)   LMP 02/23/2020   SpO2 100%   BMI 43.68 kg/m   Physical Exam Constitutional:      Appearance: Normal appearance.  HENT:     Head: Normocephalic and atraumatic.  Eyes:     General: No scleral icterus. Cardiovascular:     Rate and Rhythm: Normal rate and regular rhythm.     Heart sounds: Normal heart sounds.  Pulmonary:     Effort: Pulmonary effort is normal.     Breath sounds: Normal breath sounds.  Musculoskeletal:     Cervical back: Neck supple.     Comments: + tinel at R carpal tunnel.  Median nerve compression with increased numbness/tingling.    Neurological:     Mental Status: She is alert.  Psychiatric:        Mood and Affect: Mood normal.        Behavior: Behavior normal.     ------------------------------------------------------------------------------------------------------------------------------------------------------------------------------------------------------------------- Assessment and Plan  Dizziness Intermittent in nature, seems to be more vertiginous.  Will update labs today including CBC and iron due to previous anemia.    Carpal tunnel syndrome Given handout for home stretches to try initially.  She can consider braces at night.  If worsening we can get NCV/EMG to confirm.    GAD (generalized anxiety disorder) Discussed management options including medication and therapy.  Will start lexapro.  Return in about 6 weeks (around 04/27/2020) for anxiety.    Meds ordered this encounter  Medications  . escitalopram (LEXAPRO) 10 MG tablet    Sig: Take 5mg  daily  x1 week then increase to 10mg  daily.    Dispense:  30 tablet    Refill:  2   Orders Placed This Encounter  Procedures  . COMPLETE METABOLIC PANEL WITH GFR  . CBC  . TSH  . Fe+TIBC+Fer     Return in about 6 weeks (around 04/27/2020) for anxiety.    This visit occurred during the SARS-CoV-2 public health emergency.  Safety protocols were in place, including screening questions prior to the visit, additional usage of staff PPE, and extensive cleaning of exam room while observing appropriate contact time as indicated for disinfecting solutions.

## 2020-03-16 NOTE — Assessment & Plan Note (Signed)
Given handout for home stretches to try initially.  She can consider braces at night.  If worsening we can get NCV/EMG to confirm.

## 2020-03-17 ENCOUNTER — Encounter: Payer: Self-pay | Admitting: Family Medicine

## 2020-03-17 LAB — COMPLETE METABOLIC PANEL WITH GFR
AG Ratio: 1.3 (calc) (ref 1.0–2.5)
ALT: 8 U/L (ref 6–29)
AST: 12 U/L (ref 10–35)
Albumin: 4 g/dL (ref 3.6–5.1)
Alkaline phosphatase (APISO): 71 U/L (ref 31–125)
BUN/Creatinine Ratio: 4 (calc) — ABNORMAL LOW (ref 6–22)
BUN: 3 mg/dL — ABNORMAL LOW (ref 7–25)
CO2: 25 mmol/L (ref 20–32)
Calcium: 8.7 mg/dL (ref 8.6–10.2)
Chloride: 107 mmol/L (ref 98–110)
Creat: 0.74 mg/dL (ref 0.50–1.10)
GFR, Est African American: 113 mL/min/{1.73_m2} (ref >=60)
GFR, Est Non African American: 97 mL/min/{1.73_m2} (ref >=60)
Globulin: 3 g/dL (calc) (ref 1.9–3.7)
Glucose, Bld: 83 mg/dL (ref 65–99)
Potassium: 3.4 mmol/L — ABNORMAL LOW (ref 3.5–5.3)
Sodium: 140 mmol/L (ref 135–146)
Total Bilirubin: 0.3 mg/dL (ref 0.2–1.2)
Total Protein: 7 g/dL (ref 6.1–8.1)

## 2020-03-17 LAB — IRON,TIBC AND FERRITIN PANEL
%SAT: 3 % (calc) — ABNORMAL LOW (ref 16–45)
Ferritin: 12 ng/mL — ABNORMAL LOW (ref 16–232)
Iron: 12 ug/dL — ABNORMAL LOW (ref 40–190)
TIBC: 413 mcg/dL (calc) (ref 250–450)

## 2020-03-17 LAB — CBC
HCT: 34.4 % — ABNORMAL LOW (ref 35.0–45.0)
Hemoglobin: 9.5 g/dL — ABNORMAL LOW (ref 11.7–15.5)
MCH: 20.4 pg — ABNORMAL LOW (ref 27.0–33.0)
MCV: 74 fL — ABNORMAL LOW (ref 80.0–100.0)
MPV: 10.2 fL (ref 7.5–12.5)
Platelets: 175 10*3/uL (ref 140–400)
RBC: 4.65 10*6/uL (ref 3.80–5.10)
RDW: 18.6 % — ABNORMAL HIGH (ref 11.0–15.0)
WBC: 7.6 10*3/uL (ref 3.8–10.8)

## 2020-03-17 LAB — TSH: TSH: 2.24 mIU/L

## 2020-03-18 ENCOUNTER — Encounter: Payer: Self-pay | Admitting: Family Medicine

## 2020-05-07 ENCOUNTER — Ambulatory Visit: Payer: No Typology Code available for payment source | Admitting: Family Medicine

## 2020-06-12 ENCOUNTER — Encounter (HOSPITAL_BASED_OUTPATIENT_CLINIC_OR_DEPARTMENT_OTHER): Payer: Self-pay | Admitting: Emergency Medicine

## 2020-06-12 ENCOUNTER — Other Ambulatory Visit: Payer: Self-pay

## 2020-06-12 ENCOUNTER — Emergency Department (HOSPITAL_BASED_OUTPATIENT_CLINIC_OR_DEPARTMENT_OTHER)
Admission: EM | Admit: 2020-06-12 | Discharge: 2020-06-12 | Disposition: A | Discharge status: Home / Self Care | Payer: BC Managed Care – PPO | Attending: Emergency Medicine | Admitting: Emergency Medicine

## 2020-06-12 ENCOUNTER — Emergency Department (HOSPITAL_BASED_OUTPATIENT_CLINIC_OR_DEPARTMENT_OTHER): Payer: BC Managed Care – PPO

## 2020-06-12 DIAGNOSIS — F17210 Nicotine dependence, cigarettes, uncomplicated: Secondary | ICD-10-CM | POA: Diagnosis not present

## 2020-06-12 DIAGNOSIS — R079 Chest pain, unspecified: Secondary | ICD-10-CM | POA: Diagnosis not present

## 2020-06-12 DIAGNOSIS — R0789 Other chest pain: Secondary | ICD-10-CM | POA: Diagnosis not present

## 2020-06-12 DIAGNOSIS — R072 Precordial pain: Secondary | ICD-10-CM | POA: Diagnosis not present

## 2020-06-12 DIAGNOSIS — K219 Gastro-esophageal reflux disease without esophagitis: Secondary | ICD-10-CM | POA: Insufficient documentation

## 2020-06-12 LAB — CBC
HCT: 35.7 % — ABNORMAL LOW (ref 36.0–46.0)
Hemoglobin: 10.2 g/dL — ABNORMAL LOW (ref 12.0–15.0)
MCH: 21.5 pg — ABNORMAL LOW (ref 26.0–34.0)
MCHC: 28.6 g/dL — ABNORMAL LOW (ref 30.0–36.0)
MCV: 75.3 fL — ABNORMAL LOW (ref 80.0–100.0)
Platelets: 191 10*3/uL (ref 150–400)
RBC: 4.74 MIL/uL (ref 3.87–5.11)
RDW: 20 % — ABNORMAL HIGH (ref 11.5–15.5)
WBC: 8.2 10*3/uL (ref 4.0–10.5)
nRBC: 0 % (ref 0.0–0.2)

## 2020-06-12 LAB — BASIC METABOLIC PANEL
Anion gap: 9 (ref 5–15)
BUN: 7 mg/dL (ref 6–20)
CO2: 23 mmol/L (ref 22–32)
Calcium: 8.5 mg/dL — ABNORMAL LOW (ref 8.9–10.3)
Chloride: 105 mmol/L (ref 98–111)
Creatinine, Ser: 0.75 mg/dL (ref 0.44–1.00)
GFR, Estimated: >60 mL/min (ref >60)
Glucose, Bld: 97 mg/dL (ref 70–99)
Potassium: 3.6 mmol/L (ref 3.5–5.1)
Sodium: 137 mmol/L (ref 135–145)

## 2020-06-12 LAB — HEPATIC FUNCTION PANEL
ALT: 11 U/L (ref 0–44)
AST: 19 U/L (ref 15–41)
Albumin: 3.8 g/dL (ref 3.5–5.0)
Alkaline Phosphatase: 61 U/L (ref 38–126)
Bilirubin, Direct: 0.1 mg/dL (ref 0.0–0.2)
Indirect Bilirubin: 0.1 mg/dL — ABNORMAL LOW (ref 0.3–0.9)
Total Bilirubin: 0.2 mg/dL — ABNORMAL LOW (ref 0.3–1.2)
Total Protein: 7.4 g/dL (ref 6.5–8.1)

## 2020-06-12 LAB — PREGNANCY, URINE: Preg Test, Ur: NEGATIVE

## 2020-06-12 LAB — TROPONIN I (HIGH SENSITIVITY): Troponin I (High Sensitivity): <2 ng/L (ref <18)

## 2020-06-12 LAB — LIPASE, BLOOD: Lipase: 28 U/L (ref 11–51)

## 2020-06-12 MED ORDER — ALUM & MAG HYDROXIDE-SIMETH 200-200-20 MG/5ML PO SUSP
30.0000 mL | Freq: Once | ORAL | Status: AC
  Administered 2020-06-12: 30 mL via ORAL
  Filled 2020-06-12: qty 30

## 2020-06-12 MED ORDER — LIDOCAINE VISCOUS HCL 2 % MT SOLN
15.0000 mL | Freq: Once | OROMUCOSAL | Status: AC
  Administered 2020-06-12: 15 mL via ORAL
  Filled 2020-06-12: qty 15

## 2020-06-12 MED ORDER — OMEPRAZOLE 20 MG PO CPDR: 20.0000 mg | DELAYED_RELEASE_CAPSULE | Freq: Every day | ORAL | 0 refills | Status: DC

## 2020-06-12 NOTE — ED Provider Notes (Signed)
[facility] EMERGENCY DEPARTMENT Provider Note   CSN: [id number] Arrival date & time: 06/12/20  1107     History Chief Complaint  Patient presents with  . Chest Pain    Virginia Saltmily Erler is a 45 y.o. female with PMHx anxiety and GERD who presents to the ED today with complaint of gradual onset, constant, sharp, 7/10, substernal/epigastric pain that began yesterday afternoon. Pt reports she was sitting down when she first noticed the pain. She is currently in the process of moving and was unsure if she did too much yesterday before the pain started. She did not think much of it until she woke up this morning with persistent pain. Pt also woke up with lower back pain today and took an oxycodone with relief. Pt states that while in the waiting room she burped several times which seemed to alleviate the pressure/sharp pain she was feeling. She states it went from a 10/10 to a 7/10. Pt does mention a history of GERD - she states she used to be on Omeprazole however states she ran out several months ago. She has been taking OTC Zantac when she hurts however has not taken any in the past 2 days with her pain. Pt denies fevers, chills, shortness of breath, diaphoresis, nausea, vomiting, constipation, diarrhea, leg swelling, or any other associated symptoms. No hx DVT/PE. No recent prolonged travel or immobilization. No hemoptysis. No active malignancy. No exogenous hormone use.   The history is provided by the patient and medical records.    HPI: A 45 year old patient with a history of obesity presents for evaluation of chest pain. Initial onset of pain was more than 6 hours ago. The patient's chest pain is well-localized and is not worse with exertion. The patient's chest pain is middle- or left-sided, is not described as heaviness/pressure/tightness, is not sharp and does not radiate to the arms/jaw/neck. The patient does not complain of nausea and denies diaphoresis. The patient has smoked in the  past 90 days. The patient has no history of stroke, has no history of peripheral artery disease, denies any history of treated diabetes, has no relevant family history of coronary artery disease (first degree relative at less than age 47), is not hypertensive and has no history of hypercholesterolemia.   Past Medical History:  Diagnosis Date  . Anxiety   . GERD (gastroesophageal reflux disease)     Patient Active Problem List   Diagnosis Date Noted  . Vitamin D deficiency 03/16/2020  . Dysfunctional uterine bleeding 03/16/2020  . Polycystic ovaries 03/16/2020  . Dizziness 03/16/2020  . Carpal tunnel syndrome 03/16/2020  . GAD (generalized anxiety disorder) 03/16/2020  . Achilles tendonitis 06/04/2013  . Anemia 10/05/2012  . Menorrhagia 02/29/2012  . Hirsutism 02/29/2012    History reviewed. No pertinent surgical history.   OB History    Gravida  1   Para  1   Term  1   Preterm      AB      Living  1     SAB      IAB      Ectopic      Multiple      Live Births              Family History  Family history unknown: Yes    Social History   Tobacco Use  . Smoking status: Current Every Day Smoker    Packs/day: 1.00    Years: 18.00    Pack years: 18.00  Types: Cigarettes  . Smokeless tobacco: Never Used  Vaping Use  . Vaping Use: Never used  Substance Use Topics  . Alcohol use: Never  . Drug use: Yes    Types: Marijuana    Home Medications Prior to Admission medications   Medication Sig Start Date End Date Taking? Authorizing Provider  omeprazole (PRILOSEC) 20 MG capsule Take 1 capsule (20 mg total) by mouth daily. 06/12/20 07/12/20 Yes Corazon Nickolas, PA-C  escitalopram (LEXAPRO) 10 MG tablet Take 5mg  daily x1 week then increase to 10mg  daily. 03/16/20   , DO    Allergies    Patient has no known allergies.  Review of Systems   Review of Systems  Constitutional: Negative for appetite change, diaphoresis and fever.   Respiratory: Negative for cough and shortness of breath.   Cardiovascular: Positive for chest pain. Negative for palpitations and leg swelling.  Gastrointestinal: Positive for abdominal pain. Negative for constipation, diarrhea, nausea and vomiting.  All other systems reviewed and are negative.   Physical Exam Updated Vital Signs BP 135/77   Pulse 70   Temp 98.6 F (37 C) (Oral)   Resp (!) 24   Ht 5\' 7"  (1.702 m)   Wt 122.5 kg   LMP 05/05/2020 Comment: pt states no chance of pregnancy  SpO2 95%   BMI 42.29 kg/m   Physical Exam Vitals and nursing note reviewed.  Constitutional:      Appearance: She is obese. She is not ill-appearing or diaphoretic.  HENT:     Head: Normocephalic and atraumatic.  Eyes:     Conjunctiva/sclera: Conjunctivae normal.  Cardiovascular:     Rate and Rhythm: Normal rate and regular rhythm.     Pulses:          Radial pulses are 2+ on the right side and 2+ on the left side.       Dorsalis pedis pulses are 2+ on the right side and 2+ on the left side.     Heart sounds: Normal heart sounds.  Pulmonary:     Effort: Pulmonary effort is normal.     Breath sounds: Normal breath sounds. No decreased breath sounds, wheezing, rhonchi or rales.  Chest:     Chest wall: Tenderness (substernal) present.  Abdominal:     Palpations: Abdomen is soft.     Tenderness: There is abdominal tenderness. There is no guarding or rebound.     Comments: Soft, diffuse upper abdominal TTP, +BS throughout, no r/g/r, neg murphy's, neg mcburney's, no CVA TTP  Musculoskeletal:     Cervical back: Neck supple.     Right lower leg: No edema.     Left lower leg: No edema.  Skin:    General: Skin is warm and dry.  Neurological:     Mental Status: She is alert.     ED Results / Procedures / Treatments   Labs (all labs ordered are listed, but only abnormal results are displayed) Labs Reviewed  BASIC METABOLIC PANEL - Abnormal; Notable for the following components:       Result Value   Calcium 8.5 (*)    All other components within normal limits  CBC - Abnormal; Notable for the following components:   Hemoglobin 10.2 (*)    HCT 35.7 (*)    MCV 75.3 (*)    MCH 21.5 (*)    MCHC 28.6 (*)    RDW 20.0 (*)    All other components within normal limits  HEPATIC FUNCTION PANEL - Abnormal;  Notable for the following components:   Total Bilirubin 0.2 (*)    Indirect Bilirubin 0.1 (*)    All other components within normal limits  PREGNANCY, URINE  LIPASE, BLOOD  TROPONIN I (HIGH SENSITIVITY)    EKG EKG Interpretation  Date/Time:  Saturday June 12 2020 11:33:51 EST Ventricular Rate:  90 PR Interval:  150 QRS Duration: 80 QT Interval:  354 QTC Calculation: 433 R Axis:   68 Text Interpretation: Normal sinus rhythm Nonspecific T wave abnormality Abnormal ECG Confirmed by Virgina Norfolk [phone]) on 06/12/2020 3:30:51 PM   Radiology DG Chest Portable 1 View  Result Date: 06/12/2020 CLINICAL DATA:  Chest pain EXAM: PORTABLE CHEST 1 VIEW COMPARISON:  November 11, 2017 FINDINGS: The lungs are clear. Heart is upper normal in size with pulmonary vascularity normal. No adenopathy. No pneumothorax. No bone lesions. IMPRESSION: Lungs clear.  Heart upper normal in size. Electronically Signed   By: Bretta Bang III M.D.   On: 06/12/2020 12:15    Procedures Procedures   Medications Ordered in ED Medications  alum & mag hydroxide-simeth (MAALOX/MYLANTA) 200-200-20 MG/5ML suspension 30 mL (30 mLs Oral Given 06/12/20 1455)    And  lidocaine (XYLOCAINE) 2 % viscous mouth solution 15 mL (15 mLs Oral Given 06/12/20 1456)    ED Course  I have reviewed the triage vital signs and the nursing notes.  Pertinent labs & imaging results that were available during my care of the patient were reviewed by me and considered in my medical decision making (see chart for details).    MDM Rules/Calculators/A&P HEAR Score: 48                        45 year old female presenting  to the ED today with complaint of substernal/epigastric abdominal pain that began yesterday. Improved while in the waiting room with burping. Hx GERD - not on any PPI. Takes OTC Zantac "as needed" however none recently. On arrival to the ED VSS. Pt is afebrile, nontachycardic, and nontachypneic. Appears to be in NAD. EKG with nonspecific T wave abnormality. CXR clear. CBC without leukocytosis and Hgb stable at 10.2. BMP without electrolyte abnormalities. Troponin < 2. Preg test negative. On exam pt appears to be in NAD. She does have diffuse upper abdominal TTP and substernal chest wall TTP. No rebound or guarding, doubt acute abdomen. Pulses equal throughout. I have low suspicion for ACS at this time given troponin < 2 and heart score 3 without recommendations for repeat troponin. Given abdominal TTP on exam will add on lipase and LFTs at this time. Will also provide GI cocktail with reevaluation. If no acute findings will plan to discharge home with Rx omeprazole as I am most suspicion for GERD causing symptoms at this time. She is PERC negative today. Doubt dissection.   Lipase 28 LFTs unremarkable today  On repeat examination after GI cocktail pt reports improvement from 7/10 to 3/10. She is resting comfortably and watching TV in no acute distress. Do not feel pt needs additional workup at this time. Will discharge home with omeprazole and close PCP follow up. Pt instructed on strict return precautions including worsening pain, shortness of breath, N/V, or any other changes. She is in agreement with plan and stable for discharge.   This note was prepared using Dragon voice recognition software and may include unintentional dictation errors due to the inherent limitations of voice recognition software.  Final Clinical Impression(s) / ED Diagnoses Final diagnoses:  Gastroesophageal reflux disease without esophagitis  Nonspecific chest pain    Rx / DC Orders ED Discharge Orders         Ordered     omeprazole (PRILOSEC) 20 MG capsule  Daily        06/12/20 1535           Discharge Instructions     Your workup was reassuring in the ED today. Your symptoms may be related to acid reflux. Attached is additional information on this topic as well as food choices that can help prevent it.  Please pick up medication and take as prescribed on a daily basis.  Follow up with your PCP regarding your ED visit and for recheck of your symptoms.   Return to the ED IMMEDIATELY for any worsening symptoms including worsening pain, new shortness of breath, nausea, vomiting, diarrhea, fevers, chills, coughing up blood, leg swelling, or any other new/concerning symptoms.        Tanda Rockers, PA-C 06/12/20 1536    Alvira Monday, MD 06/13/20 [phone]

## 2020-06-12 NOTE — ED Notes (Signed)
Pt in X-Ray ?

## 2020-06-12 NOTE — Discharge Instructions (Addendum)
Your workup was reassuring in the ED today. Your symptoms may be related to acid reflux. Attached is additional information on this topic as well as food choices that can help prevent it.  Please pick up medication and take as prescribed on a daily basis.  Follow up with your PCP regarding your ED visit and for recheck of your symptoms.   Return to the ED IMMEDIATELY for any worsening symptoms including worsening pain, new shortness of breath, nausea, vomiting, diarrhea, fevers, chills, coughing up blood, leg swelling, or any other new/concerning symptoms.

## 2020-06-12 NOTE — ED Triage Notes (Signed)
Pt arrives pov with c/o central CP radiating to upper back and epigastric pain that started yesterday. Pt endorses nausea. Pt endorses 1 g tylenol today with no relief.

## 2020-06-12 NOTE — ED Notes (Signed)
Pt refused to put on a gown, she wanted to stay in her clothes.

## 2020-07-16 ENCOUNTER — Encounter: Payer: Self-pay | Admitting: Obstetrics & Gynecology

## 2020-07-16 ENCOUNTER — Other Ambulatory Visit: Payer: Self-pay

## 2020-07-16 ENCOUNTER — Ambulatory Visit (INDEPENDENT_AMBULATORY_CARE_PROVIDER_SITE_OTHER): Payer: BC Managed Care – PPO | Admitting: Obstetrics & Gynecology

## 2020-07-16 ENCOUNTER — Other Ambulatory Visit (HOSPITAL_COMMUNITY)
Admission: RE | Admit: 2020-07-16 | Discharge: 2020-07-16 | Disposition: A | Discharge status: Home / Self Care | Payer: BC Managed Care – PPO | Source: Ambulatory Visit | Attending: Obstetrics & Gynecology | Admitting: Obstetrics & Gynecology

## 2020-07-16 VITALS — BP 128/60 | HR 81 | Ht 66.0 in | Wt 253.0 lb

## 2020-07-16 DIAGNOSIS — Z01419 Encounter for gynecological examination (general) (routine) without abnormal findings: Secondary | ICD-10-CM | POA: Diagnosis not present

## 2020-07-16 DIAGNOSIS — B373 Candidiasis of vulva and vagina: Secondary | ICD-10-CM

## 2020-07-16 DIAGNOSIS — D500 Iron deficiency anemia secondary to blood loss (chronic): Secondary | ICD-10-CM

## 2020-07-16 DIAGNOSIS — Z1211 Encounter for screening for malignant neoplasm of colon: Secondary | ICD-10-CM | POA: Diagnosis not present

## 2020-07-16 DIAGNOSIS — N911 Secondary amenorrhea: Secondary | ICD-10-CM | POA: Diagnosis not present

## 2020-07-16 DIAGNOSIS — Z1231 Encounter for screening mammogram for malignant neoplasm of breast: Secondary | ICD-10-CM | POA: Diagnosis not present

## 2020-07-16 DIAGNOSIS — B3731 Acute candidiasis of vulva and vagina: Secondary | ICD-10-CM

## 2020-07-16 LAB — POCT URINE PREGNANCY: Preg Test, Ur: NEGATIVE

## 2020-07-16 NOTE — Patient Instructions (Signed)
[employer] Endocrinology (14th ed., pp. [phone]). [place], [place]: Elsevier.">  Perimenopause Perimenopause is the normal time of a woman's life when the levels of estrogen, the female hormone produced by the ovaries, begin to decrease. This leads to changes in menstrual periods before they stop completely (menopause). Perimenopause can begin 2-8 years before menopause. During perimenopause, the ovaries may or may not produce an egg and a woman can still become pregnant. What are the causes? This condition is caused by a natural change in hormone levels that happens as you get older. What increases the risk? This condition is more likely to start at an earlier age if you have certain medical conditions or have undergone treatments, including:  A tumor of the pituitary gland in the brain.  A disease that affects the ovaries and hormone production.  Certain cancer treatments, such as chemotherapy or hormone therapy, or radiation therapy on the pelvis.  Heavy smoking and excessive alcohol use.  Family history of early menopause. What are the signs or symptoms? Perimenopausal changes affect each woman differently. Symptoms of this condition may include:  Hot flashes.  Irregular menstrual periods.  Night sweats.  Changes in feelings about sex. This could be a decrease in sex drive or an increased discomfort around your sexuality.  Vaginal dryness.  Headaches.  Mood swings.  Depression.  Problems sleeping (insomnia).  Memory problems or trouble concentrating.  Irritability.  Tiredness.  Weight gain.  Anxiety.  Trouble getting pregnant. How is this diagnosed? This condition is diagnosed based on your medical history, a physical exam, your age, your menstrual history, and your symptoms. Hormone tests may also be done. How is this treated? In some cases, no treatment is needed. You and your health care provider should make a decision together about whether  treatment is necessary. Treatment will be based on your individual condition and preferences. Various treatments are available, such as:  Menopausal hormone therapy (MHT).  Medicines to treat specific symptoms.  Acupuncture.  Vitamin or herbal supplements. Before starting treatment, make sure to let your health care provider know if you have a personal or family history of:  Heart disease.  Breast cancer.  Blood clots.  Diabetes.  Osteoporosis. Follow these instructions at home: Medicines  Take over-the-counter and prescription medicines only as told by your health care provider.  Take vitamin supplements only as told by your health care provider.  Talk with your health care provider before starting any herbal supplements. Lifestyle  Do not use any products that contain nicotine or tobacco, such as cigarettes, e-cigarettes, and chewing tobacco. If you need help quitting, ask your health care provider.  Get at least 30 minutes of physical activity on 5 or more days each week.  Eat a balanced diet that includes fresh fruits and vegetables, whole grains, soybeans, eggs, lean meat, and low-fat dairy.  Avoid alcoholic and caffeinated beverages, as well as spicy foods. This may help prevent hot flashes.  Get 7-8 hours of sleep each night.  Dress in layers that can be removed to help you manage hot flashes.  Find ways to manage stress, such as deep breathing, meditation, or journaling.   General instructions  Keep track of your menstrual periods, including: ? When they occur. ? How heavy they are and how long they last. ? How much time passes between periods.  Keep track of your symptoms, noting when they start, how often you have them, and how long they last.  Use vaginal lubricants or moisturizers to help  with vaginal dryness and improve comfort during sex.  You can still become pregnant if you are having irregular periods. Make sure you use contraception during  perimenopause if you do not want to get pregnant.  Keep all follow-up visits. This is important. This includes any group therapy or counseling.   Contact a health care provider if:  You have heavy vaginal bleeding or pass blood clots.  Your period lasts more than 2 days longer than normal.  Your periods are recurring sooner than 21 days.  You bleed after having sex.  You have pain during sex. Get help right away if you have:  Chest pain, trouble breathing, or trouble talking.  Severe depression.  Pain when you urinate.  Severe headaches.  Vision problems. Summary  Perimenopause is the time when a woman's body begins to move into menopause. This may happen naturally or as a result of other health problems or medical treatments.  Perimenopause can begin 2-8 years before menopause, and it can last for several years.  Perimenopausal symptoms can be managed through medicines, lifestyle changes, and complementary therapies such as acupuncture. This information is not intended to replace advice given to you by your health care provider. Make sure you discuss any questions you have with your health care provider. Document Revised: 10/02/2019 Document Reviewed: 10/02/2019 Elsevier Patient Education  2021 [employer].

## 2020-07-16 NOTE — Progress Notes (Signed)
Pt presents today as new patient for yearly exam and to establish care. Pt is due for mammogram and pap. Pt states she has been having irregular cycles since September. Pt states she has noticed change in length of cycle and missed cycle. Pt is not currently on anything for [facility]. Pt had upt on 06/12/20 at [facility] ED which was negative.

## 2020-07-16 NOTE — Progress Notes (Signed)
Subjective:     Virginia Flowers is a 45 y.o. female here for a routine exam.G1P1 LMP 05/2020 Current complaints: Pt reports that her cycles became lighter as of Sept. It was prev very heavy. Sept through- Jan her cycles were reg in timing but, light. Then they stopped in Jan. The menses ar assoc with hot flushes and mood changes. She does not feel that she needs meds for this but, does want to know what the issue is causing this.   Gynecologic History Patient's last menstrual period was 05/01/2020 (approximate). Contraception: same sex partner Last Pap: 2013. Results were: normal Last mammogram: 11/12/2013. Results were: normal  Obstetric History OB History  Gravida Para Term Preterm AB Living  1 1 1     1   SAB IAB Ectopic Multiple Live Births          1    # Outcome Date GA Lbr Len/2nd Weight Sex Delivery Anes PTL Lv  1 Term              The following portions of the patient's history were reviewed and updated as appropriate: allergies, current medications, past family history, past medical history, past social history, past surgical history and problem list.  Review of Systems Pertinent items are noted in HPI.    Objective:  BP 128/60 (BP Location: Right Arm, Patient Position: Sitting, Cuff Size: Large)   Pulse 81   Ht 5\' 6"  (1.676 m)   Wt 253 lb (114.8 kg)   LMP 05/01/2020 (Approximate)   BMI 40.84 kg/m  General Appearance:    Alert, cooperative, no distress, appears stated age  Head:    Normocephalic, without obvious abnormality, atraumatic  Eyes:    conjunctiva/corneas clear, EOM's intact, both eyes  Ears:    Normal external ear canals, both ears  Nose:   Nares normal, septum midline, mucosa normal, no drainage    or sinus tenderness  Throat:   Lips, mucosa, and tongue normal; teeth and gums normal  Neck:   Supple, symmetrical, trachea midline, no adenopathy;    thyroid:  no enlargement/tenderness/nodules  Back:     Symmetric, no curvature, ROM normal, no CVA tenderness   Lungs:     respirations unlabored  Chest Wall:    No tenderness or deformity   Heart:    Regular rate and rhythm  Breast Exam:    No tenderness, masses, or nipple abnormality  Abdomen:     Soft, non-tender, bowel sounds active all four quadrants,    no masses, no organomegaly  Genitalia:    Normal female without lesion, discharge or tenderness     Extremities:   Extremities normal, atraumatic, no cyanosis or edema  Pulses:   2+ and symmetric all extremities  Skin:   Skin color, texture, turgor normal, no rashes or lesions     Assessment:    Healthy female exam.   Secondary amenorrhea- I suspect perimenopause.    Plan:  Virginia Flowers was seen today for gynecologic exam.  Diagnoses and all orders for this visit:  Well female exam with routine gynecological exam -     Cytology - PAP  Secondary amenorrhea -     POCT urine pregnancy -     Follicle stimulating hormone  Colon cancer screening -     Ambulatory referral to Gastroenterology  Encounter for screening mammogram for malignant neoplasm of breast -     MM 3D SCREEN BREAST BILATERAL; Future  Anemia due to chronic blood loss -  CBC  f/u in 1 year or sooner prn   Virginia Flowers, M.D., Virginia Flowers

## 2020-07-17 LAB — FOLLICLE STIMULATING HORMONE: FSH: 6.2 m[IU]/mL

## 2020-07-20 LAB — CYTOLOGY - PAP
Comment: NEGATIVE
Diagnosis: NEGATIVE
High risk HPV: NEGATIVE

## 2020-07-21 ENCOUNTER — Telehealth: Payer: Self-pay

## 2020-07-21 DIAGNOSIS — B379 Candidiasis, unspecified: Secondary | ICD-10-CM

## 2020-07-21 MED ORDER — FLUCONAZOLE 150 MG PO TABS: 150.0000 mg | ORAL_TABLET | Freq: Once | ORAL | 0 refills | Status: AC

## 2020-07-21 NOTE — Telephone Encounter (Signed)
Called pt to discuss Pap smear results. Pt made aware that her Pap smear was WNL but it shows that she has a yeast infection. Diflucan 150 mg PO once was sent to her pharmacy.  Virginia Flowers, CMA

## 2020-07-22 ENCOUNTER — Other Ambulatory Visit: Payer: Self-pay | Admitting: Obstetrics & Gynecology

## 2020-07-22 MED ORDER — FLUCONAZOLE 150 MG PO TABS: 150.0000 mg | ORAL_TABLET | Freq: Once | ORAL | 3 refills | Status: AC

## 2020-07-22 NOTE — Addendum Note (Signed)
Addended by: Willodean Rosenthal on: 07/22/2020 03:45 PM   Modules accepted: Orders

## 2020-07-23 ENCOUNTER — Telehealth: Payer: Self-pay

## 2020-07-23 NOTE — Telephone Encounter (Signed)
This is the 2nd part of a message. Please let pt know that her FSH is WNL and although she may be PERI menopause, she is NOT in menopause and the number isn't really close.   Thx,   Clh-S     Left message for patient to return call to the office. Armandina Stammer RN

## 2020-07-23 NOTE — Telephone Encounter (Signed)
-----   Message from Willodean Rosenthal, MD sent at 07/22/2020  3:46 PM EDT ----- Please call pt. Her PAP was neg but, she had evidence of yeast.  Rx sent to pharmacy.   Clh-S

## 2020-10-15 ENCOUNTER — Ambulatory Visit: Payer: BC Managed Care – PPO

## 2021-05-18 ENCOUNTER — Encounter: Payer: Self-pay | Admitting: General Practice

## 2021-07-01 ENCOUNTER — Ambulatory Visit: Payer: BC Managed Care – PPO | Admitting: Nurse Practitioner

## 2021-08-16 DIAGNOSIS — M5136 Other intervertebral disc degeneration, lumbar region: Secondary | ICD-10-CM | POA: Diagnosis not present

## 2021-08-16 DIAGNOSIS — M79605 Pain in left leg: Secondary | ICD-10-CM | POA: Diagnosis not present

## 2021-08-16 DIAGNOSIS — M5442 Lumbago with sciatica, left side: Secondary | ICD-10-CM | POA: Diagnosis not present

## 2021-08-16 DIAGNOSIS — M545 Low back pain, unspecified: Secondary | ICD-10-CM | POA: Diagnosis not present

## 2021-08-16 DIAGNOSIS — M4316 Spondylolisthesis, lumbar region: Secondary | ICD-10-CM | POA: Diagnosis not present

## 2022-01-27 DIAGNOSIS — Z20822 Contact with and (suspected) exposure to covid-19: Secondary | ICD-10-CM | POA: Diagnosis not present

## 2022-01-27 DIAGNOSIS — J069 Acute upper respiratory infection, unspecified: Secondary | ICD-10-CM | POA: Diagnosis not present

## 2023-02-14 ENCOUNTER — Ambulatory Visit: Payer: BC Managed Care – PPO | Admitting: Obstetrics & Gynecology

## 2023-06-09 ENCOUNTER — Other Ambulatory Visit: Payer: Self-pay

## 2023-06-09 ENCOUNTER — Emergency Department (HOSPITAL_BASED_OUTPATIENT_CLINIC_OR_DEPARTMENT_OTHER)
Admission: EM | Admit: 2023-06-09 | Discharge: 2023-06-10 | Discharge status: Against Medical Advice | Payer: BC Managed Care – PPO | Attending: Emergency Medicine | Admitting: Emergency Medicine

## 2023-06-09 ENCOUNTER — Encounter (HOSPITAL_BASED_OUTPATIENT_CLINIC_OR_DEPARTMENT_OTHER): Payer: Self-pay | Admitting: Emergency Medicine

## 2023-06-09 DIAGNOSIS — Z5321 Procedure and treatment not carried out due to patient leaving prior to being seen by health care provider: Secondary | ICD-10-CM | POA: Diagnosis not present

## 2023-06-09 DIAGNOSIS — R42 Dizziness and giddiness: Secondary | ICD-10-CM | POA: Diagnosis not present

## 2023-06-09 LAB — CBC WITH DIFFERENTIAL/PLATELET
Abs Immature Granulocytes: 0.01 10*3/uL (ref 0.00–0.07)
Basophils Absolute: 0.1 10*3/uL (ref 0.0–0.1)
Basophils Relative: 1 %
Eosinophils Absolute: 0.2 10*3/uL (ref 0.0–0.5)
Eosinophils Relative: 3 %
HCT: 37.6 % (ref 36.0–46.0)
Hemoglobin: 11.9 g/dL — ABNORMAL LOW (ref 12.0–15.0)
Immature Granulocytes: 0 %
Lymphocytes Relative: 39 %
Lymphs Abs: 2.2 10*3/uL (ref 0.7–4.0)
MCH: 28.2 pg (ref 26.0–34.0)
MCHC: 31.6 g/dL (ref 30.0–36.0)
MCV: 89.1 fL (ref 80.0–100.0)
Monocytes Absolute: 0.4 10*3/uL (ref 0.1–1.0)
Monocytes Relative: 7 %
Neutro Abs: 2.8 10*3/uL (ref 1.7–7.7)
Neutrophils Relative %: 50 %
Platelets: 156 10*3/uL (ref 150–400)
RBC: 4.22 MIL/uL (ref 3.87–5.11)
RDW: 16.3 % — ABNORMAL HIGH (ref 11.5–15.5)
WBC: 5.6 10*3/uL (ref 4.0–10.5)
nRBC: 0 % (ref 0.0–0.2)

## 2023-06-09 LAB — COMPREHENSIVE METABOLIC PANEL
ALT: 13 U/L (ref 0–44)
AST: 19 U/L (ref 15–41)
Albumin: 3.6 g/dL (ref 3.5–5.0)
Alkaline Phosphatase: 63 U/L (ref 38–126)
Anion gap: 6 (ref 5–15)
BUN: 6 mg/dL (ref 6–20)
CO2: 25 mmol/L (ref 22–32)
Calcium: 8.7 mg/dL — ABNORMAL LOW (ref 8.9–10.3)
Chloride: 107 mmol/L (ref 98–111)
Creatinine, Ser: 0.7 mg/dL (ref 0.44–1.00)
GFR, Estimated: >60 mL/min (ref >60)
Glucose, Bld: 101 mg/dL — ABNORMAL HIGH (ref 70–99)
Potassium: 3.3 mmol/L — ABNORMAL LOW (ref 3.5–5.1)
Sodium: 138 mmol/L (ref 135–145)
Total Bilirubin: 0.5 mg/dL (ref 0.0–1.2)
Total Protein: 7 g/dL (ref 6.5–8.1)

## 2023-06-09 LAB — URINALYSIS, MICROSCOPIC (REFLEX): RBC / HPF: >50 RBC/hpf (ref 0–5)

## 2023-06-09 LAB — URINALYSIS, ROUTINE W REFLEX MICROSCOPIC
Bilirubin Urine: NEGATIVE
Glucose, UA: NEGATIVE mg/dL
Ketones, ur: NEGATIVE mg/dL
Leukocytes,Ua: NEGATIVE
Nitrite: NEGATIVE
Protein, ur: NEGATIVE mg/dL
Specific Gravity, Urine: 1.015 (ref 1.005–1.030)
pH: 6.5 (ref 5.0–8.0)

## 2023-06-09 LAB — CBG MONITORING, ED: Glucose-Capillary: 106 mg/dL — ABNORMAL HIGH (ref 70–99)

## 2023-06-09 NOTE — ED Triage Notes (Signed)
 Pt c/o dizziness x 2d; she feels it is worse now; denies NV, no pain

## 2023-06-09 NOTE — ED Notes (Addendum)
 Called for room, no answer

## 2023-06-10 ENCOUNTER — Ambulatory Visit
Admission: EM | Admit: 2023-06-10 | Discharge: 2023-06-10 | Disposition: A | Discharge status: Home / Self Care | Payer: BC Managed Care – PPO | Attending: Family Medicine | Admitting: Family Medicine

## 2023-06-10 ENCOUNTER — Telehealth: Payer: Self-pay | Admitting: Physician Assistant

## 2023-06-10 VITALS — BP 146/85 | HR 92 | Temp 98.5°F | Resp 20

## 2023-06-10 DIAGNOSIS — E876 Hypokalemia: Secondary | ICD-10-CM

## 2023-06-10 DIAGNOSIS — M549 Dorsalgia, unspecified: Secondary | ICD-10-CM

## 2023-06-10 DIAGNOSIS — R42 Dizziness and giddiness: Secondary | ICD-10-CM | POA: Diagnosis not present

## 2023-06-10 DIAGNOSIS — G8929 Other chronic pain: Secondary | ICD-10-CM | POA: Diagnosis not present

## 2023-06-10 MED ORDER — POTASSIUM CHLORIDE ER 10 MEQ PO TBCR: 10.0000 meq | EXTENDED_RELEASE_TABLET | Freq: Every day | ORAL | 0 refills | Status: DC

## 2023-06-10 MED ORDER — MECLIZINE HCL 25 MG PO TABS: 25.0000 mg | ORAL_TABLET | Freq: Three times a day (TID) | ORAL | 1 refills | Status: DC | PRN

## 2023-06-10 MED ORDER — PREDNISONE 10 MG (21) PO TBPK: ORAL_TABLET | ORAL | 0 refills | Status: DC

## 2023-06-10 NOTE — Telephone Encounter (Signed)
 Received message patient's once prescription sent to a different pharmacy

## 2023-06-10 NOTE — ED Provider Notes (Signed)
 [facility]    CSN: [id number] Arrival date & time: 06/10/23  0813      History   Chief Complaint Chief Complaint  Patient presents with   Dizziness    Entered by patient    HPI Virginia Flowers is a 48 y.o. female.    Dizziness Dizziness started several days ago when she woke in the morning states when she rolls over in bed feels as though the room is spinning and she will get nauseous, symptoms self resolve in a minute or two.  Has had a few episodes during the day, had 1 yesterday while walking, causing her to fall.  The fall exacerbated her chronic low back pain.  She went to the emergency department last night hide EKG and lab work done but left before being seen.  Denies headache, recent change in vision, recent illness, chest pain, abdominal pain, shortness of breath, vomiting, diarrhea, paresthesias, weakness, loss of bowel or bladder control.  Past Medical History:  Diagnosis Date   Anxiety    GERD (gastroesophageal reflux disease)     Patient Active Problem List   Diagnosis Date Noted   Vitamin D  deficiency 03/16/2020   Dysfunctional uterine bleeding 03/16/2020   Polycystic ovaries 03/16/2020   Dizziness 03/16/2020   Carpal tunnel syndrome 03/16/2020   GAD (generalized anxiety disorder) 03/16/2020   Achilles tendonitis 06/04/2013   Anemia 10/05/2012   Menorrhagia 02/29/2012   Hirsutism 02/29/2012    Past Surgical History:  Procedure Laterality Date   MOUTH SURGERY      OB History     Gravida  1   Para  1   Term  1   Preterm      AB      Living  1      SAB      IAB      Ectopic      Multiple      Live Births  1            Home Medications    Prior to Admission medications   Medication Sig Start Date End Date Taking? Authorizing Provider  omeprazole  (PRILOSEC) 20 MG capsule Take 1 capsule (20 mg total) by mouth daily. 06/12/20 07/12/20  Shepard Clinch, PA-C    Family History Family History  Family history unknown:  Yes    Social History Social History   Tobacco Use   Smoking status: Every Day    Current packs/day: 1.00    Average packs/day: 1 pack/day for 18.0 years (18.0 ttl pk-yrs)    Types: Cigarettes   Smokeless tobacco: Never  Vaping Use   Vaping status: Never Used  Substance Use Topics   Alcohol use: Never   Drug use: Yes    Types: Marijuana     Allergies   Patient has no known allergies.   Review of Systems Review of Systems  Neurological:  Positive for dizziness.     Physical Exam Triage Vital Signs ED Triage Vitals  Encounter Vitals Group     BP 06/10/23 0820 (!) 146/85     Systolic BP Percentile --      Diastolic BP Percentile --      Pulse Rate 06/10/23 0820 92     Resp 06/10/23 0820 20     Temp 06/10/23 0820 98.5 F (36.9 C)     Temp Source 06/10/23 0820 Oral     SpO2 06/10/23 0820 96 %     Weight --  Height --      Head Circumference --      Peak Flow --      Pain Score 06/10/23 0834 8     Pain Loc --      Pain Education --      Exclude from Growth Chart --    No data found.  Updated Vital Signs BP (!) 146/85 (BP Location: Right Arm)   Pulse 92   Temp 98.5 F (36.9 C) (Oral)   Resp 20   SpO2 96%   Visual Acuity Right Eye Distance:   Left Eye Distance:   Bilateral Distance:    Right Eye Near:   Left Eye Near:    Bilateral Near:     Physical Exam Vitals and nursing note reviewed.  Constitutional:      Appearance: She is not ill-appearing.  HENT:     Head: Normocephalic and atraumatic.     Right Ear: Tympanic membrane and ear canal normal.     Left Ear: Tympanic membrane and ear canal normal.     Nose: No rhinorrhea.     Mouth/Throat:     Mouth: Mucous membranes are moist.  Eyes:     Extraocular Movements: Extraocular movements intact.     Conjunctiva/sclera: Conjunctivae normal.     Pupils: Pupils are equal, round, and reactive to light.  Cardiovascular:     Rate and Rhythm: Normal rate.     Heart sounds: Normal heart  sounds.  Pulmonary:     Effort: Pulmonary effort is normal.     Breath sounds: Normal breath sounds.  Musculoskeletal:        General: Normal range of motion.     Cervical back: Neck supple.  Lymphadenopathy:     Cervical: No cervical adenopathy.  Neurological:     Mental Status: She is alert and oriented to person, place, and time.     Cranial Nerves: No cranial nerve deficit.     Motor: No weakness.     Coordination: Coordination normal.     Gait: Gait normal.     Deep Tendon Reflexes: Reflexes normal.  Psychiatric:        Mood and Affect: Mood normal.      UC Treatments / Results  Labs (all labs ordered are listed, but only abnormal results are displayed) Labs Reviewed - No data to display  EKG   Radiology No results found.  Procedures Procedures (including critical care time)  Medications Ordered in UC Medications - No data to display  Initial Impression / Assessment and Plan / UC Course  I have reviewed the triage vital signs and the nursing notes.  Pertinent labs & imaging results that were available during my care of the patient were reviewed by me and considered in my medical decision making (see chart for details).     48 year old with chronic back pain presents with exacerbation of back pain secondary to a fall.  Has been having vertigo symptoms for several days which caused her to fall yesterday.  She went to the emergency department had lab work and EKG but left without being seen.  Results were reviewed, her potassium is low at 3.3, calcium was low at 8.7, hemoglobin low at 11.9, blood sugar was 106 Chart was reviewed patient has had hypokalemia in the past. Discussed with patient dizziness seems like vertigo will treat with meclizine , will give steroid Dosepak for low back pain, will get potassium for hypokalemia.  She was counseled to follow-up with her PCP  for further evaluation of the blood in her urine Final Clinical Impressions(s) / UC Diagnoses    Final diagnoses:  None   Discharge Instructions   None    ED Prescriptions   None    PDMP not reviewed this encounter.   Levell Tavano, [place] 06/10/23 [phone]

## 2023-06-10 NOTE — ED Triage Notes (Addendum)
 Pt presents with complaints of dizziness x 1 week. Pt states due to her dizziness yesterday, lost her balance and fell. Pt is now complaining of pain on left side, lower back radiating down to leg. Pt rates her overall pain an 8/10. Denies head injury and denies LOC. Pt did go to the emergency department yesterday however left due to wait time.

## 2023-06-10 NOTE — Discharge Instructions (Addendum)
 Follow-up with your primary care provider regarding the blood in your urine, your dizziness and low potassium

## 2023-06-25 ENCOUNTER — Ambulatory Visit (INDEPENDENT_AMBULATORY_CARE_PROVIDER_SITE_OTHER): Payer: BC Managed Care – PPO | Admitting: Family Medicine

## 2023-06-25 ENCOUNTER — Encounter: Payer: Self-pay | Admitting: Family Medicine

## 2023-06-25 ENCOUNTER — Telehealth: Payer: Self-pay | Admitting: Family Medicine

## 2023-06-25 VITALS — BP 125/83 | HR 85 | Temp 98.0°F | Resp 18 | Ht 65.0 in | Wt 226.7 lb

## 2023-06-25 DIAGNOSIS — Z862 Personal history of diseases of the blood and blood-forming organs and certain disorders involving the immune mechanism: Secondary | ICD-10-CM | POA: Diagnosis not present

## 2023-06-25 DIAGNOSIS — Z7689 Persons encountering health services in other specified circumstances: Secondary | ICD-10-CM

## 2023-06-25 DIAGNOSIS — R7302 Impaired glucose tolerance (oral): Secondary | ICD-10-CM | POA: Diagnosis not present

## 2023-06-25 DIAGNOSIS — R42 Dizziness and giddiness: Secondary | ICD-10-CM | POA: Diagnosis not present

## 2023-06-25 DIAGNOSIS — Z1329 Encounter for screening for other suspected endocrine disorder: Secondary | ICD-10-CM

## 2023-06-25 DIAGNOSIS — F418 Other specified anxiety disorders: Secondary | ICD-10-CM | POA: Diagnosis not present

## 2023-06-25 DIAGNOSIS — Z720 Tobacco use: Secondary | ICD-10-CM

## 2023-06-25 DIAGNOSIS — Z87898 Personal history of other specified conditions: Secondary | ICD-10-CM

## 2023-06-25 MED ORDER — MECLIZINE HCL 25 MG PO TABS: 25.0000 mg | ORAL_TABLET | Freq: Three times a day (TID) | ORAL | 1 refills | Status: DC | PRN

## 2023-06-25 NOTE — Progress Notes (Signed)
 New Patient Office Visit  Subjective    Patient ID: Virginia Flowers, female    DOB: 1975/05/07  Age: 48 y.o. MRN: [id number]  CC:  Chief Complaint  Patient presents with   Establish Care    Patient is here to establish care with new PCP   Dizziness    Patient states that she was seen at the Urgent Care around the 06/10/2023, for dizziness she was prescribed Meclizine and states that it works but she needs a refill    Virginia Flowers presents to establish care. Pt is new to me.  Pt is here to find out what is going on with her dizziness. She reports she has been dizzy for the last 2 weeks. She reports she has experienced this before but would come once every few months. She says she was walking from her kitchen to her living room, got dizzy and fell to the floor. She went to ER on 06/09/23 and had labs done. She reports they had a long wait so she left. She went to urgent care on the next day on 06/10/23. Her labs in ER showed slightly low iron and low potassium. She also was diagnosed with vertigo and given Meclizine to use. She took this TID prn and this didn't help.  Of note, pt has hx of iron deficiency anemia but reports she was taking iron supplements otc. She didn't have a primary care at that time. She denies melena or hematochezia. Her LMP was this month but reports the time before this month, her LMP was May 2024. She reports they would skip months before that time. Her last pap 2022. She last had colonoscopy in 2014 due to low iron. She reports early satiety. She does report hx of cigarette use to 1/2 ppd. She use to smoke 1ppd but trying to cut back. She has smoked 1ppd for 10 years. Pt reports wheezing and DOE with certain activity. Per review, pt has had ct scan done in 2015 that showed Pulmonary nodules. She hasn't had this followed up with.  She reports a hx of anxiety and mood disorder. She hasn't seeked help for her mental health. Flowsheet Row Office Visit from 06/25/2023 in [facility]  PHQ-9 Total Score 25          06/25/2023    9:48 AM 03/16/2020   11:11 AM  GAD 7 : Generalized Anxiety Score  Nervous, Anxious, on Edge 3 1  Control/stop worrying 3 2  Worry too much - different things 3 2  Trouble relaxing 3 2  Restless 3 1  Easily annoyed or irritable 3 1  Afraid - awful might happen 3 2  Total GAD 7 Score 21 11  Anxiety Difficulty Somewhat difficult Somewhat difficult     Outpatient Encounter Medications as of 06/25/2023  Medication Sig   meclizine (ANTIVERT) 25 MG tablet Take 1 tablet (25 mg total) by mouth 3 (three) times daily as needed for dizziness.   omeprazole (PRILOSEC) 20 MG capsule Take 1 capsule (20 mg total) by mouth daily.   potassium chloride (KLOR-CON) 10 MEQ tablet Take 1 tablet (10 mEq total) by mouth daily for 5 days.   [DISCONTINUED] predniSONE (STERAPRED UNI-PAK 21 TAB) 10 MG (21) TBPK tablet Take as directed (Patient not taking: Reported on 06/25/2023)   No facility-administered encounter medications on file as of 06/25/2023.    Past Medical History:  Diagnosis Date   Anxiety    GERD (gastroesophageal reflux  disease)     Past Surgical History:  Procedure Laterality Date   MOUTH SURGERY      Family History  Problem Relation Age of Onset   Hypertension Mother     Social History   Socioeconomic History   Marital status: Single    Spouse name: Not on file   Number of children: Not on file   Years of education: Not on file   Highest education level: Some college, no degree  Occupational History   Not on file  Tobacco Use   Smoking status: Every Day    Current packs/day: 0.50    Average packs/day: 1 pack/day for 18.7 years (18.3 ttl pk-yrs)    Types: Cigarettes    Start date: 10/30/2022    Passive exposure: Current   Smokeless tobacco: Never  Vaping Use   Vaping status: Never Used  Substance and Sexual Activity   Alcohol use: Never   Drug use: Yes    Types: Marijuana   Sexual  activity: Yes    Birth control/protection: None  Other Topics Concern   Not on file  Social History Narrative   Not on file   Social Drivers of Health   Financial Resource Strain: High Risk (06/24/2023)   Overall Financial Resource Strain (CARDIA)    Difficulty of Paying Living Expenses: Hard  Food Insecurity: Food Insecurity Present (06/24/2023)   Hunger Vital Sign    Worried About Running Out of Food in the Last Year: Sometimes true    Ran Out of Food in the Last Year: Sometimes true  Transportation Needs: Unmet Transportation Needs (06/24/2023)   PRAPARE - Administrator, Civil Service (Medical): Yes    Lack of Transportation (Non-Medical): No  Physical Activity: Insufficiently Active (06/24/2023)   Exercise Vital Sign    Days of Exercise per Week: 1 day    Minutes of Exercise per Session: 10 min  Stress: Stress Concern Present (06/24/2023)   [employer] of Occupational Health - Occupational Stress Questionnaire    Feeling of Stress : Very much  Social Connections: Socially Isolated (06/24/2023)   Social Connection and Isolation Panel [NHANES]    Frequency of Communication with Friends and Family: Once a week    Frequency of Social Gatherings with Friends and Family: Never    Attends Religious Services: 1 to 4 times per year    Active Member of [employer] or Organizations: No    Attends Engineer, structural: Not on file    Marital Status: Never married  Intimate Partner Violence: Unknown (01/27/2022)   Received from [facility]   HITS    Physically Hurt: Not on file    Insult or Talk Down To: Not on file    Threaten Physical Harm: Not on file    Scream or Curse: Not on file    Review of Systems  Respiratory:  Positive for shortness of breath and wheezing.   Neurological:  Positive for dizziness.  Psychiatric/Behavioral:  Positive for depression. The patient is nervous/anxious and has insomnia.   All other systems reviewed and are negative.        Objective    BP 125/83   Pulse 85   Temp 98 F (36.7 C) (Oral)   Resp 18   Ht 5\' 5"  (1.651 m)   Wt 226 lb 11.2 oz (102.8 kg)   SpO2 99%   BMI 37.72 kg/m   Physical Exam Vitals and nursing note reviewed.  Constitutional:      Appearance:  Normal appearance. She is normal weight.  HENT:     Head: Normocephalic and atraumatic.     Right Ear: Tympanic membrane, ear canal and external ear normal.     Left Ear: Tympanic membrane, ear canal and external ear normal.     Nose: Nose normal.     Mouth/Throat:     Mouth: Mucous membranes are moist.     Pharynx: Oropharynx is clear.  Eyes:     Conjunctiva/sclera: Conjunctivae normal.     Pupils: Pupils are equal, round, and reactive to light.  Cardiovascular:     Rate and Rhythm: Normal rate and regular rhythm.     Pulses: Normal pulses.     Heart sounds: Normal heart sounds.  Pulmonary:     Effort: Pulmonary effort is normal.     Breath sounds: Normal breath sounds.  Abdominal:     General: Abdomen is flat. Bowel sounds are normal.  Skin:    General: Skin is warm.     Capillary Refill: Capillary refill takes less than 2 seconds.  Neurological:     General: No focal deficit present.     Mental Status: She is alert and oriented to person, place, and time. Mental status is at baseline.  Psychiatric:        Mood and Affect: Mood normal.        Behavior: Behavior normal.        Thought Content: Thought content normal.        Judgment: Judgment normal.        Assessment & Plan:   Problem List Items Addressed This Visit       Other   Dizziness   Relevant Orders   Iron, TIBC and Ferritin Panel   Other Visit Diagnoses       Encounter to establish care with new doctor    -  Primary     Hx of iron deficiency anemia       Relevant Orders   Iron, TIBC and Ferritin Panel     Current nicotine use       Relevant Orders   CT CHEST WO CONTRAST     Impaired glucose tolerance       Relevant Orders   Hemoglobin A1c      Screening for thyroid disorder       Relevant Orders   TSH   T4, free     Depression with anxiety       Relevant Orders   Ambulatory referral to Behavioral Health     Hx of multiple pulmonary nodules       Relevant Orders   CT CHEST WO CONTRAST     Encounter to establish care with new doctor  Dizziness -     Iron, TIBC and Ferritin Panel  Hx of iron deficiency anemia -     Iron, TIBC and Ferritin Panel  Current nicotine use -     CT CHEST WO CONTRAST; Future  Impaired glucose tolerance -     Hemoglobin A1c  Screening for thyroid disorder -     TSH -     T4, free  Depression with anxiety -     Ambulatory referral to Behavioral Health  Hx of multiple pulmonary nodules -     CT CHEST WO CONTRAST; Future   Dizziness of unknown origin. Blood pressure is good today. Has some elevated glucose on previous CMPs. Will check A1c today along with hx of low iron and thyroid. Has had colonoscopy  but will rescreen iron levels. Advised on continued smoking cessation. Has previous CT scan in 2015 with pulmonary opacities. Will send for recheck Ct For depression/anxiety, refer to [facility] for counseling.   Return in about 6 weeks (around 08/06/2023) for Chronic condition follow up.   Suzan Slick, MD

## 2023-06-25 NOTE — Telephone Encounter (Signed)
 Prescription Request  06/25/2023  LOV: 06/25/2023  What is the name of the medication or equipment? meclizine (ANTIVERT) 25 MG tablet,   Have you contacted your pharmacy to request a refill? Yes   Which pharmacy would you like this sent to?   [id number] - [place], [place] - [address] [place] [address] Phone: [phone] Fax: [phone]    Patient notified that their request is being sent to the clinical staff for review and that they should receive a response within 2 business days.   Please advise at [facility] [phone]

## 2023-06-25 NOTE — Telephone Encounter (Signed)
 Refilled Meclizine to [facility]

## 2023-06-26 ENCOUNTER — Encounter: Payer: Self-pay | Admitting: Family Medicine

## 2023-06-26 LAB — HEMOGLOBIN A1C
Est. average glucose Bld gHb Est-mCnc: 94 mg/dL
Hgb A1c MFr Bld: 4.9 % (ref 4.8–5.6)

## 2023-06-26 LAB — IRON,TIBC AND FERRITIN PANEL
Ferritin: 19 ng/mL (ref 15–150)
Iron Saturation: 32 % (ref 15–55)
Iron: 137 ug/dL (ref 27–159)
Total Iron Binding Capacity: 433 ug/dL (ref 250–450)
UIBC: 296 ug/dL (ref 131–425)

## 2023-06-26 LAB — TSH: TSH: 2.13 u[IU]/mL (ref 0.450–4.500)

## 2023-06-26 LAB — T4, FREE: Free T4: 0.98 ng/dL (ref 0.82–1.77)

## 2023-07-12 ENCOUNTER — Ambulatory Visit: Payer: BC Managed Care – PPO

## 2023-07-12 DIAGNOSIS — Z87898 Personal history of other specified conditions: Secondary | ICD-10-CM

## 2023-07-12 DIAGNOSIS — Z720 Tobacco use: Secondary | ICD-10-CM | POA: Diagnosis not present

## 2023-07-12 DIAGNOSIS — R918 Other nonspecific abnormal finding of lung field: Secondary | ICD-10-CM

## 2023-07-12 DIAGNOSIS — I700 Atherosclerosis of aorta: Secondary | ICD-10-CM | POA: Diagnosis not present

## 2023-07-30 ENCOUNTER — Encounter: Payer: Self-pay | Admitting: Family Medicine

## 2023-08-24 ENCOUNTER — Encounter: Payer: Self-pay | Admitting: Family Medicine

## 2023-08-24 ENCOUNTER — Ambulatory Visit (INDEPENDENT_AMBULATORY_CARE_PROVIDER_SITE_OTHER): Admitting: Family Medicine

## 2023-08-24 VITALS — BP 110/74 | HR 96 | Temp 98.1°F | Resp 18 | Ht 65.0 in | Wt 230.4 lb

## 2023-08-24 DIAGNOSIS — R42 Dizziness and giddiness: Secondary | ICD-10-CM | POA: Diagnosis not present

## 2023-08-24 DIAGNOSIS — F418 Other specified anxiety disorders: Secondary | ICD-10-CM | POA: Diagnosis not present

## 2023-08-24 DIAGNOSIS — M51362 Other intervertebral disc degeneration, lumbar region with discogenic back pain and lower extremity pain: Secondary | ICD-10-CM | POA: Diagnosis not present

## 2023-08-24 MED ORDER — BACLOFEN 10 MG PO TABS: 10.0000 mg | ORAL_TABLET | Freq: Three times a day (TID) | ORAL | 0 refills | Status: DC

## 2023-08-24 MED ORDER — METHYLPREDNISOLONE 4 MG PO TBPK: ORAL_TABLET | ORAL | 0 refills | Status: DC

## 2023-08-24 NOTE — Progress Notes (Signed)
 Established Patient Office Visit  Subjective   Patient ID: Virginia Flowers, female    DOB: [birth year]  Age: 48 y.o. MRN: [id number]  Chief Complaint  Patient presents with   Follow-up    Patient is here for a follow up    HPI  Dizziness Pt reports the dizziness isn't as bad. It comes once or twice a week now. She doesn't drink a lot of water. She does report the dizziness seemed to come on with drinking coffee.   Back pain Pt reports she has a constant pain in her back and left leg. She reports she was going to PT for her back for 3 months back in 2023. She reports now it's nonstop. She says at night, it seems to be worse. She has done tylenol and advil for th pain, not helping.  She says the pain is down to her left thigh. Walking and standing   She denies prevnar today.  Depression/anxiety Pt reports she was called by [facility] but it wasn't in network with her insurance. She asks for a new referral. Flowsheet Row Office Visit from 06/25/2023 in [facility]  PHQ-9 Total Score 25          06/25/2023    9:48 AM 03/16/2020   11:11 AM  GAD 7 : Generalized Anxiety Score  Nervous, Anxious, on Edge 3 1  Control/stop worrying 3 2  Worry too much - different things 3 2  Trouble relaxing 3 2  Restless 3 1  Easily annoyed or irritable 3 1  Afraid - awful might happen 3 2  Total GAD 7 Score 21 11  Anxiety Difficulty Somewhat difficult Somewhat difficult     Review of Systems  Musculoskeletal:  Positive for back pain and joint pain.  Neurological:  Positive for dizziness.  Psychiatric/Behavioral:  Positive for depression. The patient is nervous/anxious.   All other systems reviewed and are negative.     Objective:     BP 110/74   Pulse 96   Temp 98.1 F (36.7 C) (Oral)   Resp 18   Ht 5\' 5"  (1.651 m)   Wt 230 lb 6.4 oz (104.5 kg)   SpO2 100%   BMI 38.34 kg/m  BP Readings from Last 3 Encounters:  08/24/23 110/74  06/25/23 125/83  06/10/23  (!) 146/85      Physical Exam Vitals and nursing note reviewed.  Constitutional:      Appearance: Normal appearance. She is normal weight.  HENT:     Head: Normocephalic and atraumatic.     Right Ear: External ear normal.     Left Ear: External ear normal.     Nose: Nose normal.     Mouth/Throat:     Mouth: Mucous membranes are moist.     Pharynx: Oropharynx is clear.  Eyes:     Conjunctiva/sclera: Conjunctivae normal.     Pupils: Pupils are equal, round, and reactive to light.  Cardiovascular:     Rate and Rhythm: Normal rate.  Pulmonary:     Effort: Pulmonary effort is normal.  Skin:    General: Skin is warm.     Capillary Refill: Capillary refill takes less than 2 seconds.  Neurological:     General: No focal deficit present.     Mental Status: She is alert and oriented to person, place, and time. Mental status is at baseline.  Psychiatric:        Mood and Affect: Mood normal.  Behavior: Behavior normal.        Thought Content: Thought content normal.        Judgment: Judgment normal.    No results found for any visits on 08/24/23.     The ASCVD Risk score (Arnett DK, et al., 2019) failed to calculate for the following reasons:   Cannot find a previous HDL lab   Cannot find a previous total cholesterol lab   Unable to determine if patient is Non-Hispanic African American    Assessment & Plan:   Problem List Items Addressed This Visit   None  Dizziness  Degeneration of intervertebral disc of lumbar region with discogenic back pain and lower extremity pain -     methylPREDNISolone; 6-day pack as directed  Dispense: 21 tablet; Refill: 0 -     Baclofen; Take 1 tablet (10 mg total) by mouth 3 (three) times daily.  Dispense: 30 each; Refill: 0 -     Ambulatory referral to Physical Medicine Rehab  Depression with anxiety -     Ambulatory referral to Behavioral Health   Dizziness is improving. Advised to drink more water. Due to exacerbation of back and  left sciatica, refer to PMR and start Medrol dose pack and Baclofen 10mg  TID prn for now.  Refer to new mental health facility for treatment of depression/anxiety.  No follow-ups on file.    Manette Section, MD

## 2023-09-05 ENCOUNTER — Encounter: Payer: Self-pay | Admitting: Family Medicine

## 2023-09-05 DIAGNOSIS — R42 Dizziness and giddiness: Secondary | ICD-10-CM

## 2023-09-05 NOTE — Telephone Encounter (Signed)
 Please follow up on the referral request. Patient has not been contacted for scheduling. Thanks in advance.

## 2023-09-20 MED ORDER — MECLIZINE HCL 12.5 MG PO TABS
12.5000 mg | ORAL_TABLET | Freq: Three times a day (TID) | ORAL | 0 refills | Status: AC | PRN
Start: 2023-09-20 — End: ?

## 2023-11-05 ENCOUNTER — Encounter: Payer: Self-pay | Admitting: Physical Medicine & Rehabilitation

## 2023-12-07 ENCOUNTER — Encounter: Admitting: Physical Medicine & Rehabilitation

## 2024-01-01 ENCOUNTER — Encounter: Payer: Self-pay | Admitting: Physician Assistant

## 2024-01-08 ENCOUNTER — Ambulatory Visit: Admitting: Physical Medicine & Rehabilitation

## 2024-01-25 ENCOUNTER — Encounter: Payer: Self-pay | Admitting: Physical Medicine & Rehabilitation

## 2024-01-25 ENCOUNTER — Encounter: Attending: Physical Medicine & Rehabilitation | Admitting: Physical Medicine & Rehabilitation

## 2024-01-25 VITALS — BP 127/82 | HR 79 | Ht 65.0 in | Wt 225.0 lb

## 2024-01-25 DIAGNOSIS — M47816 Spondylosis without myelopathy or radiculopathy, lumbar region: Secondary | ICD-10-CM | POA: Insufficient documentation

## 2024-01-25 DIAGNOSIS — M5136 Other intervertebral disc degeneration, lumbar region with discogenic back pain only: Secondary | ICD-10-CM | POA: Diagnosis not present

## 2024-01-25 MED ORDER — DIAZEPAM 10 MG PO TABS: 10.0000 mg | ORAL_TABLET | Freq: Four times a day (QID) | ORAL | 0 refills | Status: DC | PRN

## 2024-01-25 MED ORDER — MELOXICAM 7.5 MG PO TABS: 7.5000 mg | ORAL_TABLET | Freq: Every day | ORAL | 1 refills | Status: DC

## 2024-01-25 MED ORDER — TIZANIDINE HCL 2 MG PO TABS: 2.0000 mg | ORAL_TABLET | Freq: Three times a day (TID) | ORAL | 1 refills | Status: AC | PRN

## 2024-01-25 NOTE — Progress Notes (Signed)
 Subjective:    Patient ID: Virginia Flowers, female    DOB: 1976-01-26, 48 y.o.   MRN: [id number]  HPI  CC:  Left lateral thigh pain and low back pain  48 year old female with history of carpal tunnel syndrome and L Achilles tendinitis, dizziness, anxiety, depression and tobacco abuse, referred by primary care for chronic low back pain. Pt has had PT in 2023 which made her pain worse. Both walking and sitting aggravate pain  Needs to stand up frequently at work , (works from home)  No regular exercise, sometimes walking helps and other times it does not   No numbness or tingling in legs, although toes on Left fall asleep  CLINICAL DATA:  Left lower back and left leg pain for 5 weeks  without known injury.   EXAM:  LUMBAR SPINE - COMPLETE 4+ VIEW   COMPARISON:  None.   FINDINGS:  Minimal grade 1 anterolisthesis of L4-5 is noted secondary to  posterior facet joint hypertrophy. No definite fracture is noted.  Mild degenerative disc disease is noted at L4-5. Moderate  degenerative disc disease noted L5-S1.   IMPRESSION:  Multilevel degenerative disc disease is noted in the lumbar spine.  No acute abnormality seen.    Electronically Signed    By: Lynwood Landy Raddle M.D.    On: 08/16/2021 09:47   Tried muscle relaxer baclofen  10mg  TID  Tried Ibuprofen up to 2 tablets    Pain Inventory Average Pain 10 Pain Right Now 10 My pain is sharp and aching  In the last 24 hours, has pain interfered with the following? General activity 10 Relation with others 10 Enjoyment of life 9 What TIME of day is your pain at its worst? morning  Sleep (in general) Poor  Pain is worse with: walking, bending, and standing Pain improves with: . Relief from Meds: 0  how many minutes can you walk? 5 ability to climb steps?  yes do you drive?  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:  household duties  No problems in this area  New  pt  New pt    Family History  Problem Relation Age of Onset   Hypertension Mother    Social History   Socioeconomic History   Marital status: Single    Spouse name: Not on file   Number of children: Not on file   Years of education: Not on file   Highest education level: Some college, no degree  Occupational History   Not on file  Tobacco Use   Smoking status: Every Day    Current packs/day: 0.50    Average packs/day: 1 pack/day for 19.2 years (18.6 ttl pk-yrs)    Types: Cigarettes    Start date: 10/30/2022    Passive exposure: Current   Smokeless tobacco: Never  Vaping Use   Vaping status: Never Used  Substance and Sexual Activity   Alcohol use: Never   Drug use: Yes    Types: Marijuana   Sexual activity: Yes    Birth control/protection: None  Other Topics Concern   Not on file  Social History Narrative   Not on file   Social Drivers of Health   Financial Resource Strain: High Risk (06/24/2023)   Overall Financial Resource Strain (CARDIA)    Difficulty of Paying Living Expenses: Hard  Food Insecurity: Food Insecurity Present (06/24/2023)   Hunger Vital Sign    Worried About Running Out of Food  in the Last Year: Sometimes true    Ran Out of Food in the Last Year: Sometimes true  Transportation Needs: Unmet Transportation Needs (06/24/2023)   PRAPARE - Administrator, Civil Service (Medical): Yes    Lack of Transportation (Non-Medical): No  Physical Activity: Insufficiently Active (06/24/2023)   Exercise Vital Sign    Days of Exercise per Week: 1 day    Minutes of Exercise per Session: 10 min  Stress: Stress Concern Present (06/24/2023)   [employer] of Occupational Health - Occupational Stress Questionnaire    Feeling of Stress : Very much  Social Connections: Socially Isolated (06/24/2023)   Social Connection and Isolation Panel    Frequency of Communication with Friends and Family: Once a week    Frequency of Social Gatherings with Friends  and Family: Never    Attends Religious Services: 1 to 4 times per year    Active Member of [employer] or Organizations: No    Attends Engineer, structural: Not on file    Marital Status: Never married   Past Surgical History:  Procedure Laterality Date   MOUTH SURGERY     Past Medical History:  Diagnosis Date   Anxiety    GERD (gastroesophageal reflux disease)    BP 127/82   Pulse 79   Ht 5' 5 (1.651 m)   Wt 225 lb (102.1 kg)   SpO2 100%   BMI 37.44 kg/m   Opioid Risk Score:   Fall Risk Score:  `1  Depression screen [facility] 2/9     01/25/2024    2:58 PM 06/25/2023    9:47 AM 03/16/2020   11:10 AM  Depression screen PHQ 2/9  Decreased Interest 0 3 2  Down, Depressed, Hopeless 1 3 2   PHQ - 2 Score 1 6 4   Altered sleeping 1 3 2   Tired, decreased energy 1 3 3   Change in appetite 1 3 3   Feeling bad or failure about yourself  0 3 3  Trouble concentrating 0 3 1  Moving slowly or fidgety/restless 0 3 0  Suicidal thoughts 0 1 0  PHQ-9 Score 4 25 16   Difficult doing work/chores Somewhat difficult Somewhat difficult Somewhat difficult    Review of Systems  Musculoskeletal:  Positive for back pain.       Pain in exterior part of upper left leg and radiates to low back  All other systems reviewed and are negative.      Objective:   Physical Exam Vitals and nursing note reviewed.  Constitutional:      Appearance: Normal appearance. She is obese.  HENT:     Head: Normocephalic and atraumatic.  Eyes:     Extraocular Movements: Extraocular movements intact.     Conjunctiva/sclera: Conjunctivae normal.     Pupils: Pupils are equal, round, and reactive to light.  Musculoskeletal:     Right lower leg: No edema.     Left lower leg: No edema.     Comments: There is tenderness with even light palpation of the hips as well as tenderness over the lumbar spine with moderate pressure palpation Reduced range of motion bilateral hip internal/external rotation accompanied by  pain in the adductor group during abduction Knees show no evidence of effusion.  No joint line tenderness.  No pain with range of motion has full range of motion at the knees.  There is some reduced range of motion in the left ankle dorsiflexion.    Neurological:  Mental Status: She is alert and oriented to person, place, and time.     Gait: Gait normal.     Comments: Motor strength is 5/5 bilateral hip flexor knee extensor ankle dorsiflexor Sensation intact to light touch bilateral lower extremities Normal pinprick sensation bilateral L4 L5-S1 distribution Negative straight leg raising Ambulates without assistive device no evidence of toe drag or knee instability  Psychiatric:        Mood and Affect: Mood normal.        Behavior: Behavior normal.    General No acute distress Mood and affect appropriate       Assessment & Plan:  1.  Chronic lumbar pain multifactorial.  She has a combination between lumbar degenerative disc as well as lumbar spondylosis.  She has more pain and limitation with extension therefore I believe that the lumbar spondylosis is the primary pain generator. Not convinced she really has sciatic pain but rather some soft tissue pain in the hip and thigh on the left side.  She also can have some radiating pain from the facet joints. She has already tried p.o. medications including prescriptions.  We will try meloxicam  7.5 mg as well as tizanidine  2 mg 3 times daily as needed discussed potential for drowsiness. She is also tried physical therapy which made her pain worse. I do think she would be a good candidate for lumbar medial branch blocks left L3-L4 as well as L5 dorsal ramus and fluoroscopic guidance with contrast enhancement. We discussed that this may be a multistep process to evaluate her for radiofrequency neurotomy.  We discussed the usual duration of radiofrequency neurotomy.  We also discussed that once her pain is under better control I think she would  be a good candidate for aquatic therapy.  We did talk about long-term health related lifestyle choices including smoking cessation as well as weight reduction but at least in the short-term should be starting to move and exercise more

## 2024-01-25 NOTE — Patient Instructions (Signed)
 Take valium  prior to procedure

## 2024-02-20 ENCOUNTER — Ambulatory Visit: Admitting: Physician Assistant

## 2024-02-28 ENCOUNTER — Encounter: Admitting: Physical Medicine & Rehabilitation

## 2024-04-03 ENCOUNTER — Encounter: Payer: Self-pay | Admitting: Gastroenterology

## 2024-04-03 ENCOUNTER — Ambulatory Visit: Admitting: Gastroenterology

## 2024-04-03 VITALS — BP 114/76 | HR 68 | Ht 65.0 in | Wt 232.2 lb

## 2024-04-03 DIAGNOSIS — Z1211 Encounter for screening for malignant neoplasm of colon: Secondary | ICD-10-CM

## 2024-04-03 DIAGNOSIS — Z01818 Encounter for other preprocedural examination: Secondary | ICD-10-CM

## 2024-04-03 MED ORDER — NA SULFATE-K SULFATE-MG SULF 17.5-3.13-1.6 GM/177ML PO SOLN: 1.0000 | Freq: Once | ORAL | 0 refills | Status: AC

## 2024-04-03 NOTE — Progress Notes (Signed)
 Virginia Flowers [id number] [birth year]   Chief Complaint: Discuss colonoscopy  Referring Provider: Barrier Torrence GRADE, MD Primary GI MD: Sampson  HPI: Virginia Flowers is a 48 y.o. female with past medical history of anxiety/depression, GERD, IDA who presents today to discuss.    Seen by family medicine to establish care 06/25/2023.  Per note, has history of iron deficiency anemia, had colonoscopy in 2014 due to low iron.   Discussed the use of AI scribe software for clinical note transcription with the patient, who gave verbal consent to proceed.  History of Present Illness Virginia Flowers is a 47 year old female who presents for scheduling a colonoscopy for colon cancer screening.  Colorectal cancer screening - Presenting for scheduling of colonoscopy for colon cancer screening. - Previous colonoscopy performed in 2014. - No history of colon polyps.  Gastrointestinal symptoms - No current gastrointestinal symptoms, including reflux or dysphagia. - No blood in stool, constipation, or diarrhea. - Regular bowel movements every morning after coffee.  Iron deficiency anemia - History of iron deficiency anemia under management by primary care. - Not currently taking iron supplementation. - Initial workup included colonoscopy and upper endoscopy for gastrointestinal source; etiology of anemia remains undetermined.   Menstrual irregularities and menorrhagia - Irregular menstrual cycles, with last cycle in April. - Heavy menstrual bleeding when cycles occur.  Cardiopulmonary symptoms - No chest pain or shortness of breath. - No history of myocardial infarction or cerebrovascular accident. - Not taking anticoagulant medications.   Previous GI Procedures/Imaging      Past Medical History:  Diagnosis Date   Anxiety    GERD (gastroesophageal reflux disease)     Past Surgical History:  Procedure Laterality Date   MOUTH SURGERY      Current Outpatient Medications  Medication  Sig Dispense Refill   meclizine  (ANTIVERT ) 12.5 MG tablet Take 1 tablet (12.5 mg total) by mouth 3 (three) times daily as needed for dizziness. 30 tablet 0   tiZANidine  (ZANAFLEX ) 2 MG tablet Take 1 tablet (2 mg total) by mouth every 8 (eight) hours as needed for muscle spasms. 90 tablet 1   No current facility-administered medications for this visit.    Allergies as of 04/03/2024   (No Known Allergies)    Family History  Problem Relation Age of Onset   Hypertension Mother     Social History   Tobacco Use   Smoking status: Every Day    Current packs/day: 0.50    Average packs/day: 1 pack/day for 19.4 years (18.7 ttl pk-yrs)    Types: Cigarettes    Start date: 10/30/2022    Passive exposure: Current   Smokeless tobacco: Never  Vaping Use   Vaping status: Never Used  Substance Use Topics   Alcohol use: Never   Drug use: Yes    Types: Marijuana     Review of Systems:    Constitutional: No weight loss, fever, chills Cardiovascular: No chest pain Respiratory: No SOB  Gastrointestinal: See HPI and otherwise negative   Physical Exam:  Vital signs: BP 114/76   Pulse 68   Ht 5' 5 (1.651 m)   Wt 232 lb 4 oz (105.3 kg)   BMI 38.65 kg/m   Constitutional: Pleasant, obese female in NAD, alert and cooperative Head:  Normocephalic and atraumatic.  Eyes: No scleral icterus.  Respiratory: Respirations even and unlabored. Lungs clear to auscultation bilaterally.  No wheezes, crackles, or rhonchi.  Cardiovascular:  Regular rate and rhythm. No murmurs. No peripheral edema. Gastrointestinal:  Soft, nondistended, nontender. No rebound or guarding. Normal bowel sounds. No appreciable masses or hepatomegaly. Rectal:  Not performed.  Neurologic:  Alert and oriented x4;  grossly normal neurologically.  Skin:   Dry and intact without significant lesions or rashes. Psychiatric: Oriented to person, place and time. Demonstrates good judgement and reason without abnormal affect or  behaviors.   RELEVANT LABS AND IMAGING: CBC    Component Value Date/Time   WBC 5.6 06/09/2023 1743   RBC 4.22 06/09/2023 1743   HGB 11.9 (L) 06/09/2023 1743   HCT 37.6 06/09/2023 1743   PLT 156 06/09/2023 1743   MCV 89.1 06/09/2023 1743   MCH 28.2 06/09/2023 1743   MCHC 31.6 06/09/2023 1743   RDW 16.3 (H) 06/09/2023 1743   LYMPHSABS 2.2 06/09/2023 1743   MONOABS 0.4 06/09/2023 1743   EOSABS 0.2 06/09/2023 1743   BASOSABS 0.1 06/09/2023 1743    CMP     Component Value Date/Time   NA 138 06/09/2023 1743   K 3.3 (L) 06/09/2023 1743   CL 107 06/09/2023 1743   CO2 25 06/09/2023 1743   GLUCOSE 101 (H) 06/09/2023 1743   BUN 6 06/09/2023 1743   CREATININE 0.70 06/09/2023 1743   CREATININE 0.74 03/16/2020 0000   CALCIUM 8.7 (L) 06/09/2023 1743   PROT 7.0 06/09/2023 1743   ALBUMIN 3.6 06/09/2023 1743   AST 19 06/09/2023 1743   ALT 13 06/09/2023 1743   ALKPHOS 63 06/09/2023 1743   BILITOT 0.5 06/09/2023 1743   GFRNONAA >60 06/09/2023 1743   GFRNONAA 97 03/16/2020 0000   GFRAA 113 03/16/2020 0000     Assessment/Plan:   Assessment & Plan Colorectal cancer screening Colonoscopy due since last performed in 2014. No gastrointestinal symptoms or history of colon polyps. No anticoagulant use. Previous colonoscopy for GERD and iron deficiency anemia without identified cause. Heavy menstrual bleeding may contribute to anemia. Patient does not recall name of MD or practice where last colonoscopy was performed, unable to obtain records.  - Scheduled colonoscopy. I thoroughly discussed the procedure with the patient to include nature of the procedure, alternatives, benefits, and risks (including but not limited to bleeding, infection, perforation, anesthesia/cardiac/pulmonary complications). Patient verbalized understanding and gave verbal consent to proceed with procedure.     Camie Furbish, PA-C [facility] 04/03/2024, 9:14 AM  Patient Care Team: Colette Torrence GRADE,  MD as PCP - General (Family Medicine)

## 2024-04-03 NOTE — Patient Instructions (Signed)
 You have been scheduled for an endoscopy and colonoscopy. Please follow the written instructions given to you at your visit today.  If you use inhalers (even only as needed), please bring them with you on the day of your procedure.  DO NOT TAKE 7 DAYS PRIOR TO TEST- Trulicity (dulaglutide) Ozempic, Wegovy (semaglutide) Mounjaro, Zepbound (tirzepatide) Bydureon Bcise (exanatide extended release)  DO NOT TAKE 1 DAY PRIOR TO YOUR TEST Rybelsus (semaglutide) Adlyxin (lixisenatide) Victoza (liraglutide) Byetta (exanatide) ___________________________________________________________________________  Thank you for trusting me with your gastrointestinal care!   Camie Furbish, PA-C  _______________________________________________________  If your blood pressure at your visit was 140/90 or greater, please contact your primary care physician to follow up on this.  _______________________________________________________  If you are age 35 or older, your body mass index should be between 23-30. Your Body mass index is 38.65 kg/m. If this is out of the aforementioned range listed, please consider follow up with your Primary Care Provider.  If you are age 32 or younger, your body mass index should be between 19-25. Your Body mass index is 38.65 kg/m. If this is out of the aformentioned range listed, please consider follow up with your Primary Care Provider.   ________________________________________________________  The [facility] providers would like to encourage you to use MYCHART to communicate with providers for non-urgent requests or questions.  Due to long hold times on the telephone, sending your provider a message by [facility] may be a faster and more efficient way to get a response.  Please allow 48 business hours for a response.  Please remember that this is for non-urgent requests.  _______________________________________________________  [facility] is using a team-based  approach to care.  Your team is made up of your doctor and two to three APPS. Our APPS (Nurse Practitioners and Physician Assistants) work with your physician to ensure care continuity for you. They are fully qualified to address your health concerns and develop a treatment plan. They communicate directly with your gastroenterologist to care for you. Seeing the Advanced Practice Practitioners on your physician's team can help you by facilitating care more promptly, often allowing for earlier appointments, access to diagnostic testing, procedures, and other specialty referrals.

## 2024-04-11 ENCOUNTER — Encounter: Payer: Self-pay | Admitting: Gastroenterology

## 2024-04-11 ENCOUNTER — Ambulatory Visit: Admitting: Gastroenterology

## 2024-04-11 VITALS — BP 144/87 | HR 90 | Temp 98.2°F | Resp 15

## 2024-04-11 DIAGNOSIS — Z1211 Encounter for screening for malignant neoplasm of colon: Secondary | ICD-10-CM | POA: Diagnosis present

## 2024-04-11 MED ORDER — SODIUM CHLORIDE 0.9 % IV SOLN: 500.0000 mL | Freq: Once | INTRAVENOUS | Status: DC

## 2024-04-11 NOTE — Progress Notes (Signed)
 Pt's states no medical or surgical changes since previsit or office visit.

## 2024-04-11 NOTE — Progress Notes (Unsigned)
 A/o x 3, VSS, good SR's, pleased with anesthesia, report to RN

## 2024-04-11 NOTE — Op Note (Signed)
 [facility] Patient Name: Virginia Flowers Procedure Date: 04/11/2024 9:30 AM MRN: [id number] Endoscopist: Gustav ALONSO Mcgee , MD, [id number] Age: 47 Referring MD:  Date of Birth: May 21, 1975 Gender: Female Account #: [id number] Procedure:                Colonoscopy Indications:              Screening for colorectal malignant neoplasm Medicines:                Monitored Anesthesia Care Procedure:                Pre-Anesthesia Assessment:                           - Prior to the procedure, a History and Physical                            was performed, and patient medications and                            allergies were reviewed. The patient's tolerance of                            previous anesthesia was also reviewed. The risks                            and benefits of the procedure and the sedation                            options and risks were discussed with the patient.                            All questions were answered, and informed consent                            was obtained. Prior Anticoagulants: The patient has                            taken no anticoagulant or antiplatelet agents. ASA                            Grade Assessment: II - A patient with mild systemic                            disease. After reviewing the risks and benefits,                            the patient was deemed in satisfactory condition to                            undergo the procedure.                           After obtaining informed consent, the colonoscope  was passed under direct vision. Throughout the                            procedure, the patient's blood pressure, pulse, and                            oxygen saturations were monitored continuously. The                            Olympus Scope SN: [id number] was introduced through                            the anus with the intention of advancing to the                            cecum. The  scope was advanced to the sigmoid colon                            before the procedure was aborted. Medications were                            given. The colonoscopy was performed without                            difficulty. The patient tolerated the procedure                            well. The quality of the bowel preparation was                            poor. The rectum was photographed. Scope In: 9:44:15 AM Scope Out: 9:45:11 AM Total Procedure Duration: 0 hours 0 minutes 56 seconds  Findings:                 The perianal and digital rectal examinations were                            normal.                           A moderate amount of stool was found in the rectum                            and in the sigmoid colon, making visualization                            difficult. Lavage of the area was performed,                            resulting in incomplete clearance with continued                            poor visualization. Complications:            No immediate complications. Estimated  Blood Loss:     Estimated blood loss was minimal. Impression:               - Preparation of the colon was poor.                           - Stool in the rectum and in the sigmoid colon.                           - No specimens collected. Recommendation:           - Resume previous diet.                           - Continue present medications.                           - Repeat colonoscopy at the next available                            appointment because the bowel preparation was                            suboptimal. Taren Dymek V. Zephaniah Enyeart, MD 04/11/2024 10:00:57 AM This report has been signed electronically.

## 2024-04-11 NOTE — Progress Notes (Signed)
 [facility] History and Physical   Primary Care Physician:  Colette Torrence GRADE, MD   Reason for Procedure:  Colorectal cancer screening  Plan:    Screening colonoscopy with possible interventions as needed     HPI: Virginia Flowers is a very pleasant 48 y.o. female here for screening colonoscopy. Denies any nausea, vomiting, abdominal pain, melena or bright red blood per rectum  The risks and benefits as well as alternatives of endoscopic procedure(s) have been discussed and reviewed.  The patient was provided an opportunity to ask questions and all were answered. The patient agreed with the plan and demonstrated an understanding of the instructions.   Past Medical History:  Diagnosis Date   Anxiety    GERD (gastroesophageal reflux disease)     Past Surgical History:  Procedure Laterality Date   MOUTH SURGERY      Prior to Admission medications  Medication Sig Start Date End Date Taking? Authorizing Provider  tiZANidine  (ZANAFLEX ) 2 MG tablet Take 1 tablet (2 mg total) by mouth every 8 (eight) hours as needed for muscle spasms. 01/25/24  Yes Kirsteins, Prentice BRAVO, MD  meclizine  (ANTIVERT ) 12.5 MG tablet Take 1 tablet (12.5 mg total) by mouth 3 (three) times daily as needed for dizziness. 09/20/23   Colette Torrence GRADE, MD    Current Outpatient Medications  Medication Sig Dispense Refill   tiZANidine  (ZANAFLEX ) 2 MG tablet Take 1 tablet (2 mg total) by mouth every 8 (eight) hours as needed for muscle spasms. 90 tablet 1   meclizine  (ANTIVERT ) 12.5 MG tablet Take 1 tablet (12.5 mg total) by mouth 3 (three) times daily as needed for dizziness. 30 tablet 0   Current Facility-Administered Medications  Medication Dose Route Frequency Provider Last Rate Last Admin   0.9 %  sodium chloride  infusion  500 mL Intravenous Once Jacarius Handel V, MD        Allergies as of 04/11/2024   (No Known Allergies)    Family History  Problem Relation Age of Onset   Hypertension  Mother     Social History   Socioeconomic History   Marital status: Single    Spouse name: Not on file   Number of children: Not on file   Years of education: Not on file   Highest education level: Some college, no degree  Occupational History   Not on file  Tobacco Use   Smoking status: Every Day    Current packs/day: 0.50    Average packs/day: 1 pack/day for 19.4 years (18.7 ttl pk-yrs)    Types: Cigarettes    Start date: 10/30/2022    Passive exposure: Current   Smokeless tobacco: Never  Vaping Use   Vaping status: Never Used  Substance and Sexual Activity   Alcohol use: Never   Drug use: Yes    Types: Marijuana   Sexual activity: Yes    Birth control/protection: None  Other Topics Concern   Not on file  Social History Narrative   Not on file   Social Drivers of Health   Tobacco Use: High Risk (04/11/2024)   Patient History    Smoking Tobacco Use: Every Day    Smokeless Tobacco Use: Never    Passive Exposure: Current  Financial Resource Strain: High Risk (06/24/2023)   Overall Financial Resource Strain (CARDIA)    Difficulty of Paying Living Expenses: Hard  Food Insecurity: Food Insecurity Present (06/24/2023)   Hunger Vital Sign    Worried About Running Out of Food in the Last Year:  Sometimes true    Ran Out of Food in the Last Year: Sometimes true  Transportation Needs: Unmet Transportation Needs (06/24/2023)   PRAPARE - Administrator, Civil Service (Medical): Yes    Lack of Transportation (Non-Medical): No  Physical Activity: Insufficiently Active (06/24/2023)   Exercise Vital Sign    Days of Exercise per Week: 1 day    Minutes of Exercise per Session: 10 min  Stress: Stress Concern Present (06/24/2023)   [employer] of Occupational Health - Occupational Stress Questionnaire    Feeling of Stress : Very much  Social Connections: Socially Isolated (06/24/2023)   Social Connection and Isolation Panel    Frequency of Communication with Friends  and Family: Once a week    Frequency of Social Gatherings with Friends and Family: Never    Attends Religious Services: 1 to 4 times per year    Active Member of [employer] or Organizations: No    Attends Engineer, Structural: Not on file    Marital Status: Never married  Intimate Partner Violence: Unknown (01/27/2022)   Received from [facility]   HITS    Physically Hurt: Not on file    Insult or Talk Down To: Not on file    Threaten Physical Harm: Not on file    Scream or Curse: Not on file  Depression (PHQ2-9): Low Risk (01/25/2024)   Depression (PHQ2-9)    PHQ-2 Score: 4  Alcohol Screen: Not on file  Housing: Low Risk (06/24/2023)   Housing Stability Vital Sign    Unable to Pay for Housing in the Last Year: No    Number of Times Moved in the Last Year: 0    Homeless in the Last Year: No  Utilities: Not on file  Health Literacy: Not on file    Review of Systems:  All other review of systems negative except as mentioned in the HPI.  Physical Exam: Vital signs in last 24 hours: Temp 98.2 F (36.8 C)  General:   Alert, NAD Lungs:  Clear .   Heart:  Regular rate and rhythm Abdomen:  Soft, nontender and nondistended. Neuro/Psych:  Alert and cooperative. Normal mood and affect. A and O x 3  Reviewed labs, radiology imaging, old records and pertinent past GI work up  Patient is appropriate for planned procedure(s) and anesthesia in an ambulatory setting   K. Veena Cloud Graham , MD [phone]

## 2024-04-11 NOTE — Progress Notes (Signed)
 Patient will call back to reschedule procedure. Patient states need to check with her job.

## 2024-04-11 NOTE — Patient Instructions (Signed)
 Discharge instructions given. Incomplete procedure. Patient will call back to reschedule need to check with her job first. Resume previous medications. YOU HAD AN ENDOSCOPIC PROCEDURE TODAY AT THE [facility]:   Refer to the procedure report that was given to you for any specific questions about what was found during the examination.  If the procedure report does not answer your questions, please call your gastroenterologist to clarify.  If you requested that your care partner not be given the details of your procedure findings, then the procedure report has been included in a sealed envelope for you to review at your convenience later.  YOU SHOULD EXPECT: Some feelings of bloating in the abdomen. Passage of more gas than usual.  Walking can help get rid of the air that was put into your GI tract during the procedure and reduce the bloating. If you had a lower endoscopy (such as a colonoscopy or flexible sigmoidoscopy) you may notice spotting of blood in your stool or on the toilet paper. If you underwent a bowel prep for your procedure, you may not have a normal bowel movement for a few days.  Please Note:  You might notice some irritation and congestion in your nose or some drainage.  This is from the oxygen used during your procedure.  There is no need for concern and it should clear up in a day or so.  SYMPTOMS TO REPORT IMMEDIATELY:  Following lower endoscopy (colonoscopy or flexible sigmoidoscopy):  Excessive amounts of blood in the stool  Significant tenderness or worsening of abdominal pains  Swelling of the abdomen that is new, acute  Fever of 100F or higher   For urgent or emergent issues, a gastroenterologist can be reached at any hour by calling [phone]. Do not use MyChart messaging for urgent concerns.    DIET:  We do recommend a small meal at first, but then you may proceed to your regular diet.  Drink plenty of fluids but you should avoid alcoholic beverages  for 24 hours.  ACTIVITY:  You should plan to take it easy for the rest of today and you should NOT DRIVE or use heavy machinery until tomorrow (because of the sedation medicines used during the test).    FOLLOW UP: Our staff will call the number listed on your records the next business day following your procedure.  We will call around 7:15- 8:00 am to check on you and address any questions or concerns that you may have regarding the information given to you following your procedure. If we do not reach you, we will leave a message.     If any biopsies were taken you will be contacted by phone or by letter within the next 1-3 weeks.  Please call us  at [phone] if you have not heard about the biopsies in 3 weeks.    SIGNATURES/CONFIDENTIALITY: You and/or your care partner have signed paperwork which will be entered into your electronic medical record.  These signatures attest to the fact that that the information above on your After Visit Summary has been reviewed and is understood.  Full responsibility of the confidentiality of this discharge information lies with you and/or your care-partner.

## 2024-04-14 ENCOUNTER — Telehealth: Payer: Self-pay

## 2024-04-14 NOTE — Telephone Encounter (Signed)
°  Follow up Call-     04/11/2024    8:46 AM  Call back number  Post procedure Call Back phone  # [phone]  Permission to leave phone message Yes     Patient questions:  Do you have a fever, pain , or abdominal swelling? No. Pain Score  0 *  Have you tolerated food without any problems? Yes.    Have you been able to return to your normal activities? Yes.    Do you have any questions about your discharge instructions: Diet   No. Medications  No. Follow up visit  No.  Do you have questions or concerns about your Care? No.  Actions: * If pain score is 4 or above: No action needed, pain <4.

## 2024-04-16 ENCOUNTER — Telehealth: Payer: Self-pay

## 2024-04-16 NOTE — Telephone Encounter (Signed)
 Left message on voicemail for patient to return call to our office to schedule a colonoscopy with Dr Shila.  Per procedure report repeat colonoscopy at next available appointment because the bowel preparation was suboptimal.  Will continue efforts.

## 2024-04-21 NOTE — Telephone Encounter (Signed)
 Left message on voicemail for patient to return call to our office to schedule a colonoscopy with Dr Shila.  Per procedure report repeat colonoscopy at next available appointment because the bowel preparation was suboptimal.  Will continue efforts.

## 2024-04-25 NOTE — Telephone Encounter (Signed)
 LVM and my chart message
